# Patient Record
Sex: Female | Born: 1955
Health system: Southern US, Community
[De-identification: ages and names within clinical notes are randomized; demographics above are authoritative.]

## PROBLEM LIST (undated history)

## (undated) DIAGNOSIS — M1990 Unspecified osteoarthritis, unspecified site: Secondary | ICD-10-CM

## (undated) DIAGNOSIS — K219 Gastro-esophageal reflux disease without esophagitis: Secondary | ICD-10-CM

## (undated) DIAGNOSIS — E785 Hyperlipidemia, unspecified: Secondary | ICD-10-CM

## (undated) DIAGNOSIS — F329 Major depressive disorder, single episode, unspecified: Secondary | ICD-10-CM

## (undated) DIAGNOSIS — I2510 Atherosclerotic heart disease of native coronary artery without angina pectoris: Secondary | ICD-10-CM

## (undated) DIAGNOSIS — F419 Anxiety disorder, unspecified: Secondary | ICD-10-CM

## (undated) DIAGNOSIS — G43909 Migraine, unspecified, not intractable, without status migrainosus: Secondary | ICD-10-CM

## (undated) DIAGNOSIS — T7840XA Allergy, unspecified, initial encounter: Secondary | ICD-10-CM

## (undated) DIAGNOSIS — I447 Left bundle-branch block, unspecified: Secondary | ICD-10-CM

## (undated) DIAGNOSIS — F32A Depression, unspecified: Secondary | ICD-10-CM

## (undated) HISTORY — DX: Hyperlipidemia, unspecified: E78.5

## (undated) HISTORY — DX: Migraine, unspecified, not intractable, without status migrainosus: G43.909

## (undated) HISTORY — PX: FOOT SURGERY: SHX648

## (undated) HISTORY — DX: Atherosclerotic heart disease of native coronary artery without angina pectoris: I25.10

## (undated) HISTORY — DX: Depression, unspecified: F32.A

## (undated) HISTORY — DX: Gastro-esophageal reflux disease without esophagitis: K21.9

## (undated) HISTORY — DX: Allergy, unspecified, initial encounter: T78.40XA

## (undated) HISTORY — DX: Anxiety disorder, unspecified: F41.9

## (undated) HISTORY — DX: Unspecified osteoarthritis, unspecified site: M19.90

## (undated) HISTORY — DX: Major depressive disorder, single episode, unspecified: F32.9

## (undated) HISTORY — DX: Left bundle-branch block, unspecified: I44.7

---

## 1993-11-24 HISTORY — PX: ABDOMINAL HYSTERECTOMY: SHX81

## 2004-02-26 ENCOUNTER — Encounter: Admission: RE | Admit: 2004-02-26 | Discharge: 2004-02-26 | Payer: Self-pay | Admitting: Otolaryngology

## 2004-03-04 ENCOUNTER — Encounter: Admission: RE | Admit: 2004-03-04 | Discharge: 2004-03-04 | Payer: Self-pay | Admitting: Otolaryngology

## 2004-05-07 ENCOUNTER — Ambulatory Visit: Admission: RE | Admit: 2004-05-07 | Discharge: 2004-05-07 | Payer: Self-pay | Admitting: Pulmonary Disease

## 2004-09-19 ENCOUNTER — Encounter: Admission: RE | Admit: 2004-09-19 | Discharge: 2004-09-19 | Payer: Self-pay | Admitting: Family Medicine

## 2005-02-04 ENCOUNTER — Ambulatory Visit: Payer: Self-pay | Admitting: Pulmonary Disease

## 2006-05-28 ENCOUNTER — Ambulatory Visit: Payer: Self-pay | Admitting: Pulmonary Disease

## 2006-06-08 ENCOUNTER — Encounter: Admission: RE | Admit: 2006-06-08 | Discharge: 2006-09-06 | Payer: Self-pay | Admitting: Specialist

## 2007-08-16 ENCOUNTER — Ambulatory Visit: Payer: Self-pay | Admitting: Pulmonary Disease

## 2010-03-26 ENCOUNTER — Emergency Department (HOSPITAL_COMMUNITY): Admission: EM | Admit: 2010-03-26 | Discharge: 2010-03-26 | Payer: Self-pay | Admitting: Family Medicine

## 2010-04-04 ENCOUNTER — Emergency Department (HOSPITAL_COMMUNITY): Admission: EM | Admit: 2010-04-04 | Discharge: 2010-04-04 | Payer: Self-pay | Admitting: Family Medicine

## 2010-09-07 ENCOUNTER — Emergency Department (HOSPITAL_COMMUNITY): Admission: EM | Admit: 2010-09-07 | Discharge: 2010-09-08 | Payer: Self-pay | Admitting: Emergency Medicine

## 2011-02-11 LAB — POCT RAPID STREP A (OFFICE): Streptococcus, Group A Screen (Direct): NEGATIVE

## 2012-12-27 ENCOUNTER — Ambulatory Visit (INDEPENDENT_AMBULATORY_CARE_PROVIDER_SITE_OTHER): Payer: PRIVATE HEALTH INSURANCE | Admitting: Family Medicine

## 2012-12-27 VITALS — BP 133/71 | HR 77 | Temp 98.2°F | Resp 18 | Ht 66.0 in | Wt 208.0 lb

## 2012-12-27 DIAGNOSIS — K219 Gastro-esophageal reflux disease without esophagitis: Secondary | ICD-10-CM

## 2012-12-27 DIAGNOSIS — F329 Major depressive disorder, single episode, unspecified: Secondary | ICD-10-CM | POA: Insufficient documentation

## 2012-12-27 DIAGNOSIS — M25519 Pain in unspecified shoulder: Secondary | ICD-10-CM

## 2012-12-27 DIAGNOSIS — G43909 Migraine, unspecified, not intractable, without status migrainosus: Secondary | ICD-10-CM | POA: Insufficient documentation

## 2012-12-27 MED ORDER — PAROXETINE HCL 30 MG PO TABS: 30.0000 mg | ORAL_TABLET | ORAL | Status: DC

## 2012-12-27 MED ORDER — OMEPRAZOLE 20 MG PO CPDR: 20.0000 mg | DELAYED_RELEASE_CAPSULE | Freq: Every day | ORAL | Status: DC

## 2012-12-27 MED ORDER — AMITRIPTYLINE HCL 75 MG PO TABS: 75.0000 mg | ORAL_TABLET | Freq: Every day | ORAL | Status: DC

## 2012-12-27 MED ORDER — BUPROPION HCL ER (XL) 150 MG PO TB24: 150.0000 mg | ORAL_TABLET | Freq: Every day | ORAL | Status: DC

## 2012-12-27 MED ORDER — RIZATRIPTAN BENZOATE 5 MG PO TABS: 5.0000 mg | ORAL_TABLET | ORAL | Status: DC | PRN

## 2012-12-27 NOTE — Progress Notes (Signed)
[facility] [address] [place] [address] [phone]  Date:  12/27/2012   Name:  Mary Glass   DOB:  11-20-1956   MRN:  [id number]  PCP:  No primary provider on file.    Chief Complaint: Medication Refill and Depression   History of Present Illness:  Mary Glass is a 57 y.o. very pleasant female patient who presents with the following:  Her old PCP retired, and then her replacement PCP moved away so she is seeking to establish care with [facility].  Sh has had a rough couple of years and situation factors have exacerbated her depression. her father died last spring- she was his primary caretaker.  Then they found out that her mother has colon cancer this past summer.   She also lost her job due to missing work due to frequent migraine HA  She has severe GERD which affects her voice- she is a Sport and exercise psychologist. Omeprazole does help, but is not as good as aciphex was in the past.  However, aciphex was not covered by her insurance.  She is still taking omeprazole- she ran out of her other medications one month ago.  She did feel that she was doing well with her mood medications when she was taking them and she would like to start back on these.  She notes that she is more tearful since she stopped the medications, and she is not sleeping as well.  She denies any SI/ HI She just got back on insurance.  She would like to catch up on her health maintenance including a mammogram and colonoscopy  She is s/p hysterectomy.   She has used a Land to treat her migraine HA- this seemed to help he a lot and she is using her maxalt much less  She also hurt her right shoulder while trying to lift her father last year.  It hurts the most at night.  She has noted some radiation of pain into her right wrist and some numbness on the median side of her hand for several months. She just started using a wrist brace last night which she hopes will help   There is no problem list on  file for this patient.   Past Medical History  Diagnosis Date  . Allergy   . Depression   . Anxiety     Past Surgical History  Procedure Date  . Abdominal hysterectomy     History  Substance Use Topics  . Smoking status: Never Smoker   . Smokeless tobacco: Not on file  . Alcohol Use: No    Family History  Problem Relation Age of Onset  . Cancer Mother   . Heart disease Father   . Hypertension Brother   . Cancer Maternal Grandmother   . Heart disease Paternal Grandmother     Allergies  Allergen Reactions  . Codeine   . Penicillins     Medication list has been reviewed and updated.  Current Outpatient Prescriptions on File Prior to Visit  Medication Sig Dispense Refill  . amitriptyline (ELAVIL) 75 MG tablet Take by mouth at bedtime.      Marland Kitchen buPROPion (WELLBUTRIN) 75 MG tablet Take 75 mg by mouth 2 (two) times daily.      Marland Kitchen omeprazole (PRILOSEC) 20 MG capsule Take 20 mg by mouth daily.      Marland Kitchen PARoxetine (PAXIL) 30 MG tablet Take 30 mg by mouth every morning.        Review of Systems:  As per HPI- otherwise negative.   Physical Examination: Filed Vitals:   12/27/12 1551  BP: 133/71  Pulse: 77  Temp: 98.2 F (36.8 C)  Resp: 18   Filed Vitals:   12/27/12 1551  Height: 5\' 6"  (1.676 m)  Weight: 208 lb (94.348 kg)   Body mass index is 33.57 kg/(m^2). Ideal Body Weight: Weight in (lb) to have BMI = 25: 154.6   GEN: WDWN, NAD, Non-toxic, A & O x 3, overweight HEENT: Atraumatic, Normocephalic. Neck supple. No masses, No LAD.  Bilateral TM wnl, oropharynx normal.  PEERL,EOMI.   Ears and Nose: No external deformity. CV: RRR, No M/G/R. No JVD. No thrill. No extra heart sounds. PULM: CTA B, no wheezes, crackles, rhonchi. No retractions. No resp. distress. No accessory muscle use. ABD: S, NT, ND, +BS. No rebound. No HSM. EXTR: No c/c/e NEURO Normal gait.  PSYCH: Normally interactive. Conversant. Not depressed or anxious appearing.  Calm demeanor.  Right  shoulder: she has pain with pressure over the RC insertion, and with internal and external rotation and hawkin's  No gross deficit of right hand, no swelling or redness   Assessment and Plan: 1. Depression  buPROPion (WELLBUTRIN XL) 150 MG 24 hr tablet, amitriptyline (ELAVIL) 75 MG tablet, PARoxetine (PAXIL) 30 MG tablet  2. Shoulder pain  Ambulatory referral to Orthopedic Surgery  3. Migraine  rizatriptan (MAXALT) 5 MG tablet  4. GERD (gastroesophageal reflux disease)  omeprazole (PRILOSEC) 20 MG capsule   Refilled her medications today, taper back onto paxil.   She is aware of possible interactions between her medications, especially maxalt and paxil.  Explained that this combination can cause increased risk of serotonin syndrome.  Recommended that she halve her paxil dose on days that she uses the maxalt.    She is going to call her insurance and ask about aciphex- to see if this might be covered by her new plan  recommended that she cal [facility] her colonoscopy and mammo- she plans to do so  See patient instructions for more details.      Abbe Amsterdam, MD

## 2012-12-27 NOTE — Patient Instructions (Addendum)
Take a 1/2 of your paxil pill for the first few days, then increase to a whole pill.   We will refer you to ortho for your shoulder- you will receive a call.   Let's check back in about one month to see how you are doing and check labs Remember that combining paxil and maxalt could increase your risk of serotonin syndrome- minimize maxalt use and consider taking 1/2 of a paxil on days that you do need the maxalt

## 2013-03-31 ENCOUNTER — Ambulatory Visit (INDEPENDENT_AMBULATORY_CARE_PROVIDER_SITE_OTHER): Payer: PRIVATE HEALTH INSURANCE | Admitting: Emergency Medicine

## 2013-03-31 VITALS — BP 128/81 | HR 87 | Temp 98.4°F | Resp 16 | Ht 66.0 in | Wt 208.0 lb

## 2013-03-31 DIAGNOSIS — G44009 Cluster headache syndrome, unspecified, not intractable: Secondary | ICD-10-CM

## 2013-03-31 DIAGNOSIS — G43109 Migraine with aura, not intractable, without status migrainosus: Secondary | ICD-10-CM

## 2013-03-31 DIAGNOSIS — K219 Gastro-esophageal reflux disease without esophagitis: Secondary | ICD-10-CM

## 2013-03-31 MED ORDER — OMEPRAZOLE 20 MG PO CPDR: 40.0000 mg | DELAYED_RELEASE_CAPSULE | Freq: Every day | ORAL | Status: DC

## 2013-03-31 NOTE — Progress Notes (Signed)
[facility] [address], [place] [address] [phone]  Date:  03/31/2013   Name:  Mary Glass   DOB:  1956-01-04   MRN:  [id number]  PCP:  No PCP Per Patient    Chief Complaint: Headache   History of Present Illness:  Mary Glass is a 57 y.o. very pleasant female patient who presents with the following:  History of depression and migraine headaches.  Is out of medications and needs refills on her meds she has no family physician.  For the past year or year and one half has experienced pain in the left head associated with photophobia but no discomfort with loud noises or nausea or vomiting. Says the headaches occur over several days intermittently and start on her crown and pass over the left side of her head and then pass after short time.  recur in 15-30 minutes over hours.  No neuro or visual symptoms.  She is here out of concern that her friends informed her that she needs evaluated.  Occurrences every 4-6 weeks.  Never evaluated.  No improvement with over the counter medications or other home remedies. Denies other complaint or health concern today. Has never tried her maxalt on these headaches.  Patient Active Problem List   Diagnosis Date Noted  . Migraine 12/27/2012  . Depression 12/27/2012  . GERD (gastroesophageal reflux disease) 12/27/2012    Past Medical History  Diagnosis Date  . Allergy   . Depression   . Anxiety   . Migraine   . Arthritis     Past Surgical History  Procedure Laterality Date  . Abdominal hysterectomy    . Foot surgery      History  Substance Use Topics  . Smoking status: Never Smoker   . Smokeless tobacco: Not on file  . Alcohol Use: No    Family History  Problem Relation Age of Onset  . Cancer Mother   . Arthritis Mother   . Heart disease Father   . Hypertension Brother   . Cancer Maternal Grandmother   . Heart disease Paternal Grandmother   . Arthritis Paternal Grandmother     Allergies   Allergen Reactions  . Codeine   . Penicillins     Medication list has been reviewed and updated.  Current Outpatient Prescriptions on File Prior to Visit  Medication Sig Dispense Refill  . amitriptyline (ELAVIL) 75 MG tablet Take 1 tablet (75 mg total) by mouth at bedtime.  90 tablet  3  . buPROPion (WELLBUTRIN XL) 150 MG 24 hr tablet Take 1 tablet (150 mg total) by mouth daily.  90 tablet  3  . omeprazole (PRILOSEC) 20 MG capsule Take 1 capsule (20 mg total) by mouth daily.  90 capsule  3  . PARoxetine (PAXIL) 30 MG tablet Take 1 tablet (30 mg total) by mouth every morning.  90 tablet  3  . rizatriptan (MAXALT) 5 MG tablet Take 1 tablet (5 mg total) by mouth as needed for migraine. May repeat in 2 hours if needed  10 tablet  6   No current facility-administered medications on file prior to visit.    Review of Systems:  As per HPI, otherwise negative.    Physical Examination: Filed Vitals:   03/31/13 1754  BP: 128/81  Pulse: 87  Temp: 98.4 F (36.9 C)  Resp: 16   Filed Vitals:   03/31/13 1754  Height: 5\' 6"  (1.676 m)  Weight: 208 lb (94.348 kg)   Body  mass index is 33.59 kg/(m^2). Ideal Body Weight: Weight in (lb) to have BMI = 25: 154.6  GEN: WDWN, NAD, Non-toxic, A & O x 3 HEENT: Atraumatic, Normocephalic. Neck supple. No masses, No LAD.  PRRERLA EOMI fundi benign.  CN 2-12 intact Ears and Nose: No external deformity. CV: RRR, No M/G/R. No JVD. No thrill. No extra heart sounds. PULM: CTA B, no wheezes, crackles, rhonchi. No retractions. No resp. distress. No accessory muscle use. ABD: S, NT, ND, +BS. No rebound. No HSM. EXTR: No c/c/e NEURO Normal gait.  PSYCH: Normally interactive. Conversant. Not depressed or anxious appearing.  Calm demeanor.    Assessment and Plan: Migraine Cluster headache Try maxaalt Refill omeprazole To 104  Signed,  Phillips Odor, MD

## 2013-03-31 NOTE — Patient Instructions (Addendum)
Cluster Headache  Cluster headaches are recognized by their pattern of deep, intense head pain. They normally occur on one side of the head. Typically, cluster headaches:   Are severe in nature.   Occur repeatedly over weeks to months at a time and are then followed by periods of no headaches.   Can last 15 minutes to 3 hours.   Occur at the same time each day, often at night.   Occur several times a day.  CAUSES  The exact cause of a cluster headache is not known. Some things can trigger a cluster headache, such as:   Smoking.   Alcohol use.   Lack of sleep.   High altitude travel, such as airline travel.   Change of seasons.   Certain foods, such as foods or drinks that contain nitrates, glutamate, aspartame, or tyramine.  SYMPTOMS    Severe pain that begins in or around the eye or temple.   One-sided head pain.   Feeling sick to your stomach (nauseous).   Sensitivity to light.   Runny nose.   Eye redness, tearing, and nasal stuffiness on the side of your head where you are experiencing pain.   Sweaty, pale skin of the face.   Droopy eyelid.  DIAGNOSIS   Cluster headaches are diagnosed based on symptoms and physical exam. Your caregiver may order a computerized X-ray scan (CT or CAT scan) or a computerized magnetic scan (MRI) of your head or other lab tests to see if your headaches are caused from other medical conditions.   TREATMENT    Medications for pain relief and to prevent recurrent attacks.   Oxygen for pain relief.   Biofeedback programs may help reduce headache pain.  Some people may need a combination of medicines for cluster headaches. It may be helpful to keep a headache diary. This may help you find a trend for what is triggering your headaches. Your caregiver can develop a treatment plan that can be helpful to you.   HOME CARE INSTRUCTIONS   During cluster periods:   Follow a regular sleep schedule.Do not vary the amount and time that you sleep from day to day. It is  important to stay on the same schedule during a cluster period to help prevent headaches.   Avoid alcohol.   Stop smoking.  SEEK IMMEDIATE MEDICAL CARE IF:    You have any changes from your previous cluster headaches either in intensity or frequency.   You are not getting relief from medicines you are taking.   You faint.   You develop weakness or numbness, especially on one side of your body or face.   You develop double vision.   You develop nausea or vomiting.   You cannot keep your balance or have difficulty talking or walking.   You develop neck pain or neck stiffness.   You have a fever.  Document Released: 11/10/2005 Document Revised: 02/02/2012 Document Reviewed: 02/08/2011  Exit[employer] Patient Information 2013 [employer].

## 2013-04-22 ENCOUNTER — Ambulatory Visit: Payer: PRIVATE HEALTH INSURANCE | Admitting: Family Medicine

## 2013-05-04 ENCOUNTER — Telehealth: Payer: Self-pay

## 2013-05-04 DIAGNOSIS — G43909 Migraine, unspecified, not intractable, without status migrainosus: Secondary | ICD-10-CM

## 2013-05-04 DIAGNOSIS — F329 Major depressive disorder, single episode, unspecified: Secondary | ICD-10-CM

## 2013-05-04 DIAGNOSIS — K219 Gastro-esophageal reflux disease without esophagitis: Secondary | ICD-10-CM

## 2013-05-04 NOTE — Telephone Encounter (Signed)
PT STATES SHE HAVE SIGNED UP FOR [facility] AND SHE HAVE A FAX NUMBER FOR [place] TO FAX ALL HER MEDICINE TO THEM PLEASE FAX TO [phone] AND YOU MAY REACH PT AT [phone]

## 2013-05-05 MED ORDER — PAROXETINE HCL 30 MG PO TABS: 30.0000 mg | ORAL_TABLET | ORAL | Status: DC

## 2013-05-05 MED ORDER — RIZATRIPTAN BENZOATE 5 MG PO TABS: 5.0000 mg | ORAL_TABLET | ORAL | Status: DC | PRN

## 2013-05-05 MED ORDER — BUPROPION HCL ER (XL) 150 MG PO TB24: 150.0000 mg | ORAL_TABLET | Freq: Every day | ORAL | Status: DC

## 2013-05-05 MED ORDER — OMEPRAZOLE 20 MG PO CPDR: 40.0000 mg | DELAYED_RELEASE_CAPSULE | Freq: Every day | ORAL | Status: DC

## 2013-05-05 NOTE — Telephone Encounter (Signed)
Pt verified that she needs these sent to [facility].

## 2013-05-05 NOTE — Telephone Encounter (Signed)
Pt needs refills on omeprazole, paxil, wellbutrin, and maxalt.

## 2013-05-05 NOTE — Telephone Encounter (Signed)
Sent Rx's in.

## 2013-11-13 ENCOUNTER — Other Ambulatory Visit: Payer: Self-pay | Admitting: Physician Assistant

## 2014-01-16 ENCOUNTER — Ambulatory Visit (INDEPENDENT_AMBULATORY_CARE_PROVIDER_SITE_OTHER): Payer: BC Managed Care – PPO | Admitting: Internal Medicine

## 2014-01-16 ENCOUNTER — Encounter: Payer: Self-pay | Admitting: Internal Medicine

## 2014-01-16 VITALS — BP 124/86 | HR 85 | Temp 98.0°F | Ht 65.25 in | Wt 216.5 lb

## 2014-01-16 DIAGNOSIS — R232 Flushing: Secondary | ICD-10-CM

## 2014-01-16 DIAGNOSIS — K219 Gastro-esophageal reflux disease without esophagitis: Secondary | ICD-10-CM

## 2014-01-16 DIAGNOSIS — G43909 Migraine, unspecified, not intractable, without status migrainosus: Secondary | ICD-10-CM

## 2014-01-16 DIAGNOSIS — E669 Obesity, unspecified: Secondary | ICD-10-CM

## 2014-01-16 DIAGNOSIS — N951 Menopausal and female climacteric states: Secondary | ICD-10-CM

## 2014-01-16 DIAGNOSIS — G4700 Insomnia, unspecified: Secondary | ICD-10-CM | POA: Insufficient documentation

## 2014-01-16 DIAGNOSIS — F3289 Other specified depressive episodes: Secondary | ICD-10-CM

## 2014-01-16 DIAGNOSIS — F32A Depression, unspecified: Secondary | ICD-10-CM

## 2014-01-16 DIAGNOSIS — F329 Major depressive disorder, single episode, unspecified: Secondary | ICD-10-CM

## 2014-01-16 DIAGNOSIS — Z0000 Encounter for general adult medical examination without abnormal findings: Secondary | ICD-10-CM

## 2014-01-16 MED ORDER — RABEPRAZOLE SODIUM 20 MG PO TBEC: 20.0000 mg | DELAYED_RELEASE_TABLET | Freq: Every day | ORAL | Status: DC

## 2014-01-16 MED ORDER — AMITRIPTYLINE HCL 75 MG PO TABS: 75.0000 mg | ORAL_TABLET | Freq: Every day | ORAL | Status: DC

## 2014-01-16 MED ORDER — PAROXETINE HCL 30 MG PO TABS: 30.0000 mg | ORAL_TABLET | Freq: Every day | ORAL | Status: DC

## 2014-01-16 MED ORDER — ZOLPIDEM TARTRATE 5 MG PO TABS: 5.0000 mg | ORAL_TABLET | Freq: Every evening | ORAL | Status: DC | PRN

## 2014-01-16 MED ORDER — BUPROPION HCL ER (XL) 150 MG PO TB24: 150.0000 mg | ORAL_TABLET | Freq: Every day | ORAL | Status: DC

## 2014-01-16 MED ORDER — RIZATRIPTAN BENZOATE 5 MG PO TABS: 5.0000 mg | ORAL_TABLET | ORAL | Status: DC | PRN

## 2014-01-16 NOTE — Assessment & Plan Note (Signed)
Encouraged her to work on diet and exercise 

## 2014-01-16 NOTE — Assessment & Plan Note (Signed)
Not well controlled on amitriptyline She agrees to try low dose [place]

## 2014-01-16 NOTE — Patient Instructions (Addendum)

## 2014-01-16 NOTE — Assessment & Plan Note (Signed)
Seems to be more frequent but overall controlled with Maxalt Follow up with Dr. Catalina Gravel

## 2014-01-16 NOTE — Progress Notes (Signed)
Pre visit review using our clinic review tool, if applicable. No additional management support is needed unless otherwise documented below in the visit note. 

## 2014-01-16 NOTE — Assessment & Plan Note (Signed)
She request to switch back to Aciphex RX ordered today D/C prilosec

## 2014-01-16 NOTE — Progress Notes (Signed)
HPI  Pt presents to the clinic today to establish care. She has not had a PCP in the last few years. She has gone to [facility] if she needs something. She has multiple concerns today.  1- Insomnia: she has been on amitriptyline. She reports that it is not as effective as it has been in the past. She has not tried anything else in the past. She has been hesitant to try ambien in the past because she is concerned that it is too strong. 2- Depression: Seems to be getting worse since the death of her parents. She is on Paxil and wellbutrin. She does not feel that either of theses are effective. She has not considered speaking with a therapist.  3- Headaches. These have been increasing in frequency over the last year. They seem to be worse with stress. She is on maxalt which works really well. She is seen by Dr. Catalina Gravel for her headaches. 4- Hot flashes: These mostly occur during the day. She has tried black cohosh and estroven with some relief. She is hesitant about starting hormone therapy but would like to know what her options are. 5- GERD: not well controlled on prilosec. She would like to be switched back to aciphex, but she thinks her insurance will require a PA.  Flu: not this last year Tetanus: unsure of date Pap Smear: 2012 Mammogram: more than 2 years ago Colon Screening: never Eye Doctor: yearly Dentist: as needed  Past Medical History  Diagnosis Date  . Allergy   . Depression   . Anxiety   . Migraine   . Arthritis     Current Outpatient Prescriptions  Medication Sig Dispense Refill  . amitriptyline (ELAVIL) 75 MG tablet Take 1 tablet (75 mg total) by mouth at bedtime.  90 tablet  3  . buPROPion (WELLBUTRIN XL) 150 MG 24 hr tablet Take 1 tablet (150 mg total) by mouth daily. PATIENT NEEDS OFFICE VISIT FOR ADDITIONAL REFILLS  30 tablet  0  . cetirizine (ZYRTEC) 10 MG tablet Take 10 mg by mouth daily.      . Glucosamine-Chondroitin (GLUCOSAMINE CHONDR COMPLEX PO) Take 1 capsule by mouth  daily.      Marland Kitchen KRILL OIL PO Take 1 capsule by mouth 2 (two) times daily.      . Multiple Vitamins-Minerals (PRESERVISION AREDS PO) Take 1 capsule by mouth 2 (two) times daily.      Marland Kitchen omeprazole (PRILOSEC) 20 MG capsule Take 2 capsules (40 mg total) by mouth daily. PATIENT NEEDS OFFICE VISIT FOR ADDITIONAL REFILLS  180 capsule  0  . PARoxetine (PAXIL) 30 MG tablet Take 1 tablet (30 mg total) by mouth daily. PATIENT NEEDS OFFICE VISIT FOR ADDITIONAL REFILLS  30 tablet  0  . rizatriptan (MAXALT) 5 MG tablet Take 1 tablet (5 mg total) by mouth as needed for migraine. May repeat in 2 hours if needed  30 tablet  1   No current facility-administered medications for this visit.    Allergies  Allergen Reactions  . Codeine   . Penicillins     Family History  Problem Relation Age of Onset  . Arthritis Mother   . Colon cancer Mother   . Heart disease Father   . Parkinson's disease Father   . Hypertension Brother   . Cancer Maternal Grandmother   . Heart disease Paternal Grandmother   . Arthritis Paternal Grandmother     History   Social History  . Marital Status: Married    Spouse Name:  N/A    Number of Children: N/A  . Years of Education: N/A   Occupational History  . Not on file.   Social History Main Topics  . Smoking status: Never Smoker   . Smokeless tobacco: Never Used  . Alcohol Use: No  . Drug Use: No  . Sexual Activity: Yes    Birth Control/ Protection: None   Other Topics Concern  . Not on file   Social History Narrative  . No narrative on file    ROS:  Constitutional: Pt reports headaches. Denies fever, malaise, fatigue, or abrupt weight changes.  HEENT: Denies eye pain, eye redness, ear pain, ringing in the ears, wax buildup, runny nose, nasal congestion, bloody nose, or sore throat. Respiratory: Denies difficulty breathing, shortness of breath, cough or sputum production.   Cardiovascular: Denies chest pain, chest tightness, palpitations or swelling in the  hands or feet.  Gastrointestinal: Pt reports reflux. Denies abdominal pain, bloating, constipation, diarrhea or blood in the stool.  GU: Denies frequency, urgency, pain with urination, blood in urine, odor or discharge. Musculoskeletal: Denies decrease in range of motion, difficulty with gait, muscle pain or joint pain and swelling.  Skin: Pt reports dry skin. Denies redness, rashes, lesions or ulcercations.  Neurological: Pt reports insomnia. Denies dizziness, difficulty with memory, difficulty with speech or problems with balance and coordination.  Psych: Pt reports anxiety, depression and stress. Denies SI/HI.  No other specific complaints in a complete review of systems (except as listed in HPI above).  PE:  BP 124/86  Pulse 85  Temp(Src) 98 F (36.7 C) (Oral)  Ht 5' 5.25" (1.657 m)  Wt 216 lb 8 oz (98.204 kg)  BMI 35.77 kg/m2  SpO2 97% Wt Readings from Last 3 Encounters:  01/16/14 216 lb 8 oz (98.204 kg)  03/31/13 208 lb (94.348 kg)  12/27/12 208 lb (94.348 kg)    General: Appears her stated age, obese but well developed, well nourished in NAD. HEENT: Head: normal shape and size; Eyes: sclera white, no icterus, conjunctiva pink, PERRLA and EOMs intact; Ears: Tm's gray and intact, normal light reflex; Nose: mucosa pink and moist, septum midline; Throat/Mouth: Teeth present, mucosa pink and moist, no lesions or ulcerations noted.  Neck: Normal range of motion. Neck supple, trachea midline. No massses, lumps or thyromegaly present.  Cardiovascular: Normal rate and rhythm. S1,S2 noted.  No murmur, rubs or gallops noted. No JVD or BLE edema. No carotid bruits noted. Pulmonary/Chest: Normal effort and positive vesicular breath sounds. No respiratory distress. No wheezes, rales or ronchi noted.  Abdomen: Soft and nontender. Normal bowel sounds, no bruits noted. No distention or masses noted. Liver, spleen and kidneys non palpable. Musculoskeletal: Normal range of motion. No signs of  joint swelling. No difficulty with gait.  Neurological: Alert and oriented. Cranial nerves II-XII intact. Coordination normal. +DTRs bilaterally. Psychiatric: Mood and affect normal. Behavior is normal. Judgment and thought content normal.      Assessment and Plan:  Prevent Health Maintenance:  Encouraged pt to work on diet and exercise Will obtain basic screening labs today including A1C Will order mammogram and colon screening She declines all prophylactic vaccines at this time  RTC in 6 months or sooner if needed

## 2014-01-16 NOTE — Assessment & Plan Note (Signed)
She does not want to start HRT  She will continue black cohosh OTC

## 2014-01-16 NOTE — Assessment & Plan Note (Signed)
Worse since the loss of her parents She thinks the medication is not effective She disagrees with going up on the wellbutrin at this time I encouraged her to discuss her feelings with a therapist

## 2014-01-17 ENCOUNTER — Other Ambulatory Visit: Payer: Self-pay

## 2014-01-17 DIAGNOSIS — G4700 Insomnia, unspecified: Secondary | ICD-10-CM

## 2014-01-17 LAB — COMPREHENSIVE METABOLIC PANEL
ALT: 38 U/L — ABNORMAL HIGH (ref 0–35)
AST: 28 U/L (ref 0–37)
Albumin: 4 g/dL (ref 3.5–5.2)
Alkaline Phosphatase: 84 U/L (ref 39–117)
BUN: 10 mg/dL (ref 6–23)
CO2: 27 mEq/L (ref 19–32)
Calcium: 9.3 mg/dL (ref 8.4–10.5)
Chloride: 106 mEq/L (ref 96–112)
Creatinine, Ser: 0.9 mg/dL (ref 0.4–1.2)
GFR: 71.34 mL/min (ref >60.00)
Glucose, Bld: 99 mg/dL (ref 70–99)
Potassium: 3.8 mEq/L (ref 3.5–5.1)
Sodium: 140 mEq/L (ref 135–145)
Total Bilirubin: 0.4 mg/dL (ref 0.3–1.2)
Total Protein: 7 g/dL (ref 6.0–8.3)

## 2014-01-17 LAB — LIPID PANEL
Cholesterol: 246 mg/dL — ABNORMAL HIGH (ref 0–200)
HDL: 32.8 mg/dL — ABNORMAL LOW (ref >39.00)
Total CHOL/HDL Ratio: 8
Triglycerides: 398 mg/dL — ABNORMAL HIGH (ref 0.0–149.0)
VLDL: 79.6 mg/dL — ABNORMAL HIGH (ref 0.0–40.0)

## 2014-01-17 LAB — TSH: TSH: 0.79 u[IU]/mL (ref 0.35–5.50)

## 2014-01-17 LAB — CBC
HCT: 40.8 % (ref 36.0–46.0)
Hemoglobin: 13.3 g/dL (ref 12.0–15.0)
MCHC: 32.7 g/dL (ref 30.0–36.0)
MCV: 87.1 fl (ref 78.0–100.0)
Platelets: 356 10*3/uL (ref 150.0–400.0)
RBC: 4.68 Mil/uL (ref 3.87–5.11)
RDW: 13.6 % (ref 11.5–14.6)
WBC: 7.7 10*3/uL (ref 4.5–10.5)

## 2014-01-17 LAB — LDL CHOLESTEROL, DIRECT: Direct LDL: 147.7 mg/dL

## 2014-01-17 LAB — HEMOGLOBIN A1C: Hgb A1c MFr Bld: 5.7 % (ref 4.6–6.5)

## 2014-01-17 MED ORDER — ZOLPIDEM TARTRATE 5 MG PO TABS: 5.0000 mg | ORAL_TABLET | Freq: Every evening | ORAL | Status: DC | PRN

## 2014-01-17 NOTE — Telephone Encounter (Signed)
Rx called into pharmacy as instructed 

## 2014-01-18 ENCOUNTER — Telehealth: Payer: Self-pay

## 2014-01-20 ENCOUNTER — Other Ambulatory Visit: Payer: Self-pay

## 2014-01-20 MED ORDER — ROSUVASTATIN CALCIUM 10 MG PO TABS: 10.0000 mg | ORAL_TABLET | Freq: Every day | ORAL | Status: DC

## 2014-01-23 NOTE — Telephone Encounter (Signed)
Opened in error

## 2014-01-24 ENCOUNTER — Telehealth: Payer: Self-pay

## 2014-01-24 NOTE — Telephone Encounter (Signed)
Ok noted - thank you

## 2014-01-24 NOTE — Telephone Encounter (Signed)
Pt left v/m pt has been taking ambien for 1 week and pt thinks Lorrin Mais is causing h/as and pt is going to stop [place] and pt is going to restart amitriptyline.

## 2014-02-06 ENCOUNTER — Encounter: Payer: Self-pay | Admitting: Internal Medicine

## 2014-02-11 ENCOUNTER — Other Ambulatory Visit: Payer: Self-pay | Admitting: Physician Assistant

## 2014-02-15 ENCOUNTER — Encounter: Payer: Self-pay | Admitting: Internal Medicine

## 2014-02-15 ENCOUNTER — Ambulatory Visit (INDEPENDENT_AMBULATORY_CARE_PROVIDER_SITE_OTHER): Payer: BC Managed Care – PPO | Admitting: Internal Medicine

## 2014-02-15 VITALS — BP 124/68 | HR 89 | Temp 97.8°F | Ht 65.25 in | Wt 219.5 lb

## 2014-02-15 DIAGNOSIS — B351 Tinea unguium: Secondary | ICD-10-CM

## 2014-02-15 DIAGNOSIS — G4700 Insomnia, unspecified: Secondary | ICD-10-CM

## 2014-02-15 DIAGNOSIS — E785 Hyperlipidemia, unspecified: Secondary | ICD-10-CM

## 2014-02-15 DIAGNOSIS — G43909 Migraine, unspecified, not intractable, without status migrainosus: Secondary | ICD-10-CM

## 2014-02-15 MED ORDER — TRAMADOL HCL 50 MG PO TABS: 50.0000 mg | ORAL_TABLET | Freq: Three times a day (TID) | ORAL | Status: DC | PRN

## 2014-02-15 NOTE — Assessment & Plan Note (Signed)
On maxalt and amitriptyline Will call in tramadol only for severe headaches No more that 20 tabs per month [facility] with [facility]

## 2014-02-15 NOTE — Assessment & Plan Note (Signed)
Discussed options- topical treatment vs systemic treatment She would rather follow up with her podiatrist first

## 2014-02-15 NOTE — Progress Notes (Signed)
Subjective:    Patient ID: Mary Glass, female    DOB: Apr 07, 1956, 58 y.o.   MRN: [id number]  HPI Pt presents to the clinic today for medication followup. At her last visit, she reported that the amitriptyline was not as effective as it once had been. We switched her to lose dose ambien. She feels like the [place] Mais was causing her to wake up with headaches. She has stopped that and request to go back on amitriptyline.  She also has a few other concerns:  Migraines: She tends to get left sided headaches with the weather changes. She describes the headaches as sharp and severe. She reports that she takes maxalt which seems to help if she takes it when she first notices the headache. She also takes amitriptyline for that as well. Sometimes the headaches get so bad that the meds she has does not work. She request RX for tramadol for severe headaches. [facility].  Toenail fungus: She noticed this in her right big toe shortly after getting a pedicure for her daughters wedding. She has not tried anything OTC. She does have a podiatrist and she is considering going to see him.  Review of Systems      Past Medical History  Diagnosis Date  . Allergy   . Depression   . Anxiety   . Migraine   . Arthritis     Current Outpatient Prescriptions  Medication Sig Dispense Refill  . amitriptyline (ELAVIL) 75 MG tablet Take 1 tablet (75 mg total) by mouth at bedtime.  90 tablet  3  . buPROPion (WELLBUTRIN XL) 150 MG 24 hr tablet Take 1 tablet (150 mg total) by mouth daily. PATIENT NEEDS OFFICE VISIT FOR ADDITIONAL REFILLS  90 tablet  3  . cetirizine (ZYRTEC) 10 MG tablet Take 10 mg by mouth daily.      . Glucosamine-Chondroitin (GLUCOSAMINE CHONDR COMPLEX PO) Take 1 capsule by mouth daily.      Marland Kitchen KRILL OIL PO Take 1 capsule by mouth 2 (two) times daily.      . Multiple Vitamins-Minerals (PRESERVISION AREDS PO) Take 1 capsule by mouth 2 (two) times  daily.      Marland Kitchen PARoxetine (PAXIL) 30 MG tablet Take 1 tablet (30 mg total) by mouth daily. PATIENT NEEDS OFFICE VISIT FOR ADDITIONAL REFILLS  90 tablet  3  . rizatriptan (MAXALT) 5 MG tablet Take 1 tablet (5 mg total) by mouth as needed for migraine. May repeat in 2 hours if needed  30 tablet  1  . rosuvastatin (CRESTOR) 10 MG tablet Take 1 tablet (10 mg total) by mouth daily.  30 tablet  2  . traMADol (ULTRAM) 50 MG tablet Take 1 tablet (50 mg total) by mouth every 8 (eight) hours as needed.  20 tablet  0   No current facility-administered medications for this visit.    Allergies  Allergen Reactions  . Ambien [Zolpidem Tartrate] Other (See Comments)    headaches  . Codeine   . Penicillins     Family History  Problem Relation Age of Onset  . Arthritis Mother   . Colon cancer Mother   . Heart disease Father   . Parkinson's disease Father   . Hypertension Brother   . Cancer Maternal Grandmother   . Heart disease Paternal Grandmother   . Arthritis Paternal Grandmother     History   Social History  . Marital Status: Married    Spouse Name:  N/A    Number of Children: N/A  . Years of Education: N/A   Occupational History  . Not on file.   Social History Main Topics  . Smoking status: Never Smoker   . Smokeless tobacco: Never Used  . Alcohol Use: No  . Drug Use: No  . Sexual Activity: Yes    Birth Control/ Protection: None   Other Topics Concern  . Not on file   Social History Narrative  . No narrative on file     Constitutional: Denies fever, malaise, fatigue, headache or abrupt weight changes.  Skin: Pt reports discoloration of right big toe. Denies redness, rashes, lesions or ulcercations.    No other specific complaints in a complete review of systems (except as listed in HPI above).  Objective:   Physical Exam   BP 124/68  Pulse 89  Temp(Src) 97.8 F (36.6 C) (Oral)  Ht 5' 5.25" (1.657 m)  Wt 219 lb 8 oz (99.565 kg)  BMI 36.26 kg/m2  SpO2 94% Wt  Readings from Last 3 Encounters:  02/15/14 219 lb 8 oz (99.565 kg)  01/16/14 216 lb 8 oz (98.204 kg)  03/31/13 208 lb (94.348 kg)    General: Appears her stated age, well developed, well nourished in NAD. Skin: Warm, dry and intact. Fungal infection noted of right big toenail.  Cardiovascular: Normal rate and rhythm. S1,S2 noted.  No murmur, rubs or gallops noted. No JVD or BLE edema. No carotid bruits noted. Pulmonary/Chest: Normal effort and positive vesicular breath sounds. No respiratory distress. No wheezes, rales or ronchi noted.  Abdomen: Soft and nontender. Normal bowel sounds, no bruits noted. No distention or masses noted. Liver, spleen and kidneys non palpable. Musculoskeletal: Normal range of motion. No signs of joint swelling. No difficulty with gait.  Neurological: Alert and oriented. Cranial nerves II-XII intact. Coordination normal. +DTRs bilaterally. Psychiatric: Mood and affect normal. Behavior is normal. Judgment and thought content normal.     BMET    Component Value Date/Time   NA 140 01/16/2014 1614   K 3.8 01/16/2014 1614   CL 106 01/16/2014 1614   CO2 27 01/16/2014 1614   GLUCOSE 99 01/16/2014 1614   BUN 10 01/16/2014 1614   CREATININE 0.9 01/16/2014 1614   CALCIUM 9.3 01/16/2014 1614    Lipid Panel     Component Value Date/Time   CHOL 246* 01/16/2014 1614   TRIG 398.0* 01/16/2014 1614   HDL 32.80* 01/16/2014 1614   CHOLHDL 8 01/16/2014 1614   VLDL 79.6* 01/16/2014 1614    CBC    Component Value Date/Time   WBC 7.7 01/16/2014 1614   RBC 4.68 01/16/2014 1614   HGB 13.3 01/16/2014 1614   HCT 40.8 01/16/2014 1614   PLT 356.0 01/16/2014 1614   MCV 87.1 01/16/2014 1614   MCHC 32.7 01/16/2014 1614   RDW 13.6 01/16/2014 1614    Hgb A1C Lab Results  Component Value Date   HGBA1C 5.7 01/16/2014        Assessment & Plan:

## 2014-02-15 NOTE — Progress Notes (Signed)
Pre visit review using our clinic review tool, if applicable. No additional management support is needed unless otherwise documented below in the visit note. 

## 2014-02-15 NOTE — Assessment & Plan Note (Signed)
She had headaches with the [place] She prefers to switch back to amitriptyline

## 2014-02-15 NOTE — Patient Instructions (Addendum)
Migraine Headache A migraine headache is an intense, throbbing pain on one or both sides of your head. A migraine can last for 30 minutes to several hours. CAUSES  The exact cause of a migraine headache is not always known. However, a migraine may be caused when nerves in the brain become irritated and release chemicals that cause inflammation. This causes pain. Certain things may also trigger migraines, such as:  Alcohol.  Smoking.  Stress.  Menstruation.  Aged cheeses.  Foods or drinks that contain nitrates, glutamate, aspartame, or tyramine.  Lack of sleep.  Chocolate.  Caffeine.  Hunger.  Physical exertion.  Fatigue.  Medicines used to treat chest pain (nitroglycerine), birth control pills, estrogen, and some blood pressure medicines. SIGNS AND SYMPTOMS  Pain on one or both sides of your head.  Pulsating or throbbing pain.  Severe pain that prevents daily activities.  Pain that is aggravated by any physical activity.  Nausea, vomiting, or both.  Dizziness.  Pain with exposure to bright lights, loud noises, or activity.  General sensitivity to bright lights, loud noises, or smells. Before you get a migraine, you may get warning signs that a migraine is coming (aura). An aura may include:  Seeing flashing lights.  Seeing bright spots, halos, or zig-zag lines.  Having tunnel vision or blurred vision.  Having feelings of numbness or tingling.  Having trouble talking.  Having muscle weakness. DIAGNOSIS  A migraine headache is often diagnosed based on:  Symptoms.  Physical exam.  A CT scan or MRI of your head. These imaging tests cannot diagnose migraines, but they can help rule out other causes of headaches. TREATMENT Medicines may be given for pain and nausea. Medicines can also be given to help prevent recurrent migraines.  HOME CARE INSTRUCTIONS  Only take over-the-counter or prescription medicines for pain or discomfort as directed by your  health care provider. The use of long-term narcotics is not recommended.  Lie down in a dark, quiet room when you have a migraine.  Keep a journal to find out what may trigger your migraine headaches. For example, write down:  What you eat and drink.  How much sleep you get.  Any change to your diet or medicines.  Limit alcohol consumption.  Quit smoking if you smoke.  Get 7 9 hours of sleep, or as recommended by your health care provider.  Limit stress.  Keep lights dim if bright lights bother you and make your migraines worse. SEEK IMMEDIATE MEDICAL CARE IF:   Your migraine becomes severe.  You have a fever.  You have a stiff neck.  You have vision loss.  You have muscular weakness or loss of muscle control.  You start losing your balance or have trouble walking.  You feel faint or pass out.  You have severe symptoms that are different from your first symptoms. MAKE SURE YOU:   Understand these instructions.  Will watch your condition.  Will get help right away if you are not doing well or get worse. Document Released: 11/10/2005 Document Revised: 08/31/2013 Document Reviewed: 07/18/2013 ExitCare Patient Information 2014 [employer].  

## 2014-03-28 ENCOUNTER — Ambulatory Visit (AMBULATORY_SURGERY_CENTER): Payer: Self-pay | Admitting: *Deleted

## 2014-03-28 VITALS — Ht 65.0 in | Wt 219.0 lb

## 2014-03-28 DIAGNOSIS — Z1211 Encounter for screening for malignant neoplasm of colon: Secondary | ICD-10-CM

## 2014-03-28 MED ORDER — MOVIPREP 100 G PO SOLR: 1.0000 | Freq: Once | ORAL | Status: DC

## 2014-03-28 NOTE — Progress Notes (Signed)
No egg or soy allergy. No anesthesia problems.  No home O2.  No diet meds.  

## 2014-04-11 ENCOUNTER — Encounter: Payer: Self-pay | Admitting: Internal Medicine

## 2014-04-11 ENCOUNTER — Ambulatory Visit (AMBULATORY_SURGERY_CENTER): Payer: BC Managed Care – PPO | Admitting: Internal Medicine

## 2014-04-11 VITALS — BP 100/56 | HR 66 | Temp 99.2°F | Resp 16 | Ht 65.0 in | Wt 219.0 lb

## 2014-04-11 DIAGNOSIS — D126 Benign neoplasm of colon, unspecified: Secondary | ICD-10-CM

## 2014-04-11 DIAGNOSIS — Z8371 Family history of colonic polyps: Secondary | ICD-10-CM

## 2014-04-11 DIAGNOSIS — Z800 Family history of malignant neoplasm of digestive organs: Secondary | ICD-10-CM

## 2014-04-11 DIAGNOSIS — Z1211 Encounter for screening for malignant neoplasm of colon: Secondary | ICD-10-CM

## 2014-04-11 MED ORDER — SODIUM CHLORIDE 0.9 % IV SOLN: 500.0000 mL | INTRAVENOUS | Status: DC

## 2014-04-11 NOTE — Patient Instructions (Signed)
Discharge instructions given with verbal understanding. Handouts on polyps. Resume previous medications. YOU HAD AN ENDOSCOPIC PROCEDURE TODAY AT THE [facility]: Refer to the procedure report that was given to you for any specific questions about what was found during the examination.  If the procedure report does not answer your questions, please call your gastroenterologist to clarify.  If you requested that your care partner not be given the details of your procedure findings, then the procedure report has been included in a sealed envelope for you to review at your convenience later.  YOU SHOULD EXPECT: Some feelings of bloating in the abdomen. Passage of more gas than usual.  Walking can help get rid of the air that was put into your GI tract during the procedure and reduce the bloating. If you had a lower endoscopy (such as a colonoscopy or flexible sigmoidoscopy) you may notice spotting of blood in your stool or on the toilet paper. If you underwent a bowel prep for your procedure, then you may not have a normal bowel movement for a few days.  DIET: Your first meal following the procedure should be a light meal and then it is ok to progress to your normal diet.  A half-sandwich or bowl of soup is an example of a good first meal.  Heavy or fried foods are harder to digest and may make you feel nauseous or bloated.  Likewise meals heavy in dairy and vegetables can cause extra gas to form and this can also increase the bloating.  Drink plenty of fluids but you should avoid alcoholic beverages for 24 hours.  ACTIVITY: Your care partner should take you home directly after the procedure.  You should plan to take it easy, moving slowly for the rest of the day.  You can resume normal activity the day after the procedure however you should NOT DRIVE or use heavy machinery for 24 hours (because of the sedation medicines used during the test).    SYMPTOMS TO REPORT IMMEDIATELY: A  gastroenterologist can be reached at any hour.  During normal business hours, 8:30 AM to 5:00 PM Monday through Friday, call [phone].  After hours and on weekends, please call the GI answering service at [phone] who will take a message and have the physician on call contact you.   Following lower endoscopy (colonoscopy or flexible sigmoidoscopy):  Excessive amounts of blood in the stool  Significant tenderness or worsening of abdominal pains  Swelling of the abdomen that is new, acute  Fever of 100F or higher  FOLLOW UP: If any biopsies were taken you will be contacted by phone or by letter within the next 1-3 weeks.  Call your gastroenterologist if you have not heard about the biopsies in 3 weeks.  Our staff will call the home number listed on your records the next business day following your procedure to check on you and address any questions or concerns that you may have at that time regarding the information given to you following your procedure. This is a courtesy call and so if there is no answer at the home number and we have not heard from you through the emergency physician on call, we will assume that you have returned to your regular daily activities without incident.  SIGNATURES/CONFIDENTIALITY: You and/or your care partner have signed paperwork which will be entered into your electronic medical record.  These signatures attest to the fact that that the information above on your After Visit Summary has been   reviewed and is understood.  Full responsibility of the confidentiality of this discharge information lies with you and/or your care-partner. 

## 2014-04-11 NOTE — Progress Notes (Signed)
Called to room to assist during endoscopic procedure.  Patient ID and intended procedure confirmed with present staff. Received instructions for my participation in the procedure from the performing physician.  

## 2014-04-11 NOTE — Op Note (Signed)
[facility]  [address] [place], [phone]   COLONOSCOPY PROCEDURE REPORT  PATIENT: Mary, Glass  MR#: [id number] BIRTHDATE: [birth year] , 50  yrs. old GENDER: Female ENDOSCOPIST: Jerene Bears, MD REFERRED BY: Webb Silversmith, NP PROCEDURE DATE:  04/11/2014 PROCEDURE:   Colonoscopy with cold biopsy polypectomy and Colonoscopy with snare polypectomy First Screening Colonoscopy - Avg.  risk and is 50 yrs.  old or older - No.  Prior Negative Screening - Now for repeat screening. N/A  History of Adenoma - Now for follow-up colonoscopy & has been > or = to 3 yrs.  N/A  Polyps Removed Today? Yes. ASA CLASS:   Class II INDICATIONS:elevated risk screening, Patient's family history of colon polyps, and Patient's immediate family history of colon cancer. MEDICATIONS: MAC sedation, administered by CRNA and propofol (Diprivan) 450mg  IV  DESCRIPTION OF PROCEDURE:   After the risks benefits and alternatives of the procedure were thoroughly explained, informed consent was obtained.  A digital rectal exam revealed no rectal mass.   The LB [id number]  endoscope was introduced through the anus and advanced to the cecum, which was identified by both the appendix and ileocecal valve. No adverse events experienced. The quality of the prep was good, using MoviPrep  The instrument was then slowly withdrawn as the colon was fully examined.  COLON FINDINGS: Four sessile polyps measuring 3-6 mm in size were found at the cecum (2) and in the ascending colon (2).  Polypectomy was performed with cold forceps (1 cecum) and using cold snare (3). All resections were complete and all polyp tissue was completely retrieved.   A sessile polyp measuring 10 mm in size with a mucous cap was found at the hepatic flexure.  A polypectomy was performed using snare cautery.  The resection was complete and the polyp tissue was completely retrieved. Remaining colonic mucosa unremarkable.   Retroflexed views revealed no abnormalities. The time to cecum=7 minutes 20 seconds.  Withdrawal time=16 minutes 17 seconds.  The scope was withdrawn and the procedure completed.  COMPLICATIONS: There were no complications.     ENDOSCOPIC IMPRESSION: 1.   Four sessile polyps measuring 3-6 mm in size were found at the cecum and in the ascending colon; Polypectomy was performed with cold forceps and using cold snare 2.   Sessile polyp measuring 10 mm in size was found at the hepatic flexure; polypectomy was performed using snare cautery  RECOMMENDATIONS: 1.  Hold aspirin, aspirin products, and anti-inflammatory medication for 2 weeks. 2.  Await pathology results 3.  Timing of repeat colonoscopy will be determined by pathology findings. 4.  You will receive a letter within 1-2 weeks with the results of your biopsy as well as final recommendations.  Please call my office if you have not received a letter after 3 weeks.   eSigned:  Jerene Bears, MD 04/11/2014 12:07 PM   cc: The Patient; Webb Silversmith, NP   PATIENT NAME:  Mary, Glass MR#: [id number]

## 2014-04-12 ENCOUNTER — Telehealth: Payer: Self-pay

## 2014-04-12 NOTE — Telephone Encounter (Signed)
  Follow up Call-  Call back number 04/11/2014  Post procedure Call Back phone  # [phone]  Permission to leave phone message Yes     Patient questions:  Do you have a fever, pain , or abdominal swelling? no Pain Score  0 *  Have you tolerated food without any problems? yes  Have you been able to return to your normal activities? yes  Do you have any questions about your discharge instructions: Diet   no Medications  no Follow up visit  no  Do you have questions or concerns about your Care? no  Actions: * If pain score is 4 or above: No action needed, pain <4.

## 2014-04-18 ENCOUNTER — Encounter: Payer: Self-pay | Admitting: Internal Medicine

## 2014-04-18 ENCOUNTER — Other Ambulatory Visit: Payer: BC Managed Care – PPO

## 2014-04-19 ENCOUNTER — Other Ambulatory Visit: Payer: BC Managed Care – PPO

## 2014-04-25 ENCOUNTER — Other Ambulatory Visit: Payer: Self-pay | Admitting: Internal Medicine

## 2014-04-26 ENCOUNTER — Other Ambulatory Visit: Payer: BC Managed Care – PPO

## 2014-04-27 ENCOUNTER — Other Ambulatory Visit (INDEPENDENT_AMBULATORY_CARE_PROVIDER_SITE_OTHER): Payer: BC Managed Care – PPO

## 2014-04-27 DIAGNOSIS — E785 Hyperlipidemia, unspecified: Secondary | ICD-10-CM

## 2014-04-27 LAB — LIPID PANEL
Cholesterol: 129 mg/dL (ref 0–200)
HDL: 31.3 mg/dL — AB (ref >39.00)
LDL Cholesterol: 56 mg/dL (ref 0–99)
NonHDL: 97.7
Total CHOL/HDL Ratio: 4
Triglycerides: 207 mg/dL — ABNORMAL HIGH (ref 0.0–149.0)
VLDL: 41.4 mg/dL — AB (ref 0.0–40.0)

## 2014-05-11 ENCOUNTER — Telehealth: Payer: Self-pay

## 2014-05-11 NOTE — Telephone Encounter (Signed)
Pt left v/m requesting change from aciphex to omeprazole due to ins not covering aciphex twice a day. Left v/m requesting pt to cb;need to know what pharmacy.

## 2014-05-12 MED ORDER — OMEPRAZOLE 20 MG PO CPDR
20.0000 mg | DELAYED_RELEASE_CAPSULE | Freq: Two times a day (BID) | ORAL | Status: DC
Start: ? — End: 2014-11-27

## 2014-05-12 NOTE — Telephone Encounter (Signed)
Change that to 90 day supply # 180, 1 refill

## 2014-05-12 NOTE — Telephone Encounter (Signed)
Rx sent through e-scribe  

## 2014-05-12 NOTE — Telephone Encounter (Signed)
Pt said Webb Silversmith NP has never prescribed the omeprazole 20 mg but was on med list. Pt request 90 day supply to [facility].

## 2014-05-12 NOTE — Telephone Encounter (Signed)
I am ok with this, please send in prilosec 20 mg BID , # 60 , with 2 refills

## 2014-05-17 ENCOUNTER — Telehealth: Payer: Self-pay | Admitting: Internal Medicine

## 2014-05-17 NOTE — Telephone Encounter (Signed)
Patient Information:  Caller Name: Mary Glass  Phone: [phone]  Patient: Mary Glass, Mary Glass  Gender: Female  DOB: [birth year]  Age: 58 Years  PCP: Webb Silversmith  Office Follow Up:  Does the office need to follow up with this patient?: No  Instructions For The Office: N/A  RN Note:  Pt states she already scheduled appt for 05/18/14, but would like to be seen today if possible after 3:00 PM.  No appts after 3PM available, checked other offices.  Home care advice given pt.   Symptoms  Reason For Call & Symptoms: Pt reports she has swollen left ankle and big toe, warm to the touch with redness.  Reviewed Health History In EMR: Yes  Reviewed Medications In EMR: Yes  Reviewed Allergies In EMR: Yes  Reviewed Surgeries / Procedures: Yes  Date of Onset of Symptoms: 05/16/2014  Guideline(s) Used:  Toe Pain  Disposition Per Guideline:   See Today in Office  Reason For Disposition Reached:   Looks like a boil, infected sore, deep ulcer, or other infected rash (spreading redness, pus)  Advice Given:  Call Back If:  You become worse.  Patient Will Follow Care Advice:  YES

## 2014-05-18 ENCOUNTER — Encounter: Payer: Self-pay | Admitting: Internal Medicine

## 2014-05-18 ENCOUNTER — Ambulatory Visit (INDEPENDENT_AMBULATORY_CARE_PROVIDER_SITE_OTHER): Payer: BC Managed Care – PPO | Admitting: Internal Medicine

## 2014-05-18 VITALS — BP 120/76 | HR 74 | Temp 97.9°F | Wt 214.0 lb

## 2014-05-18 DIAGNOSIS — E785 Hyperlipidemia, unspecified: Secondary | ICD-10-CM | POA: Insufficient documentation

## 2014-05-18 DIAGNOSIS — M25579 Pain in unspecified ankle and joints of unspecified foot: Secondary | ICD-10-CM

## 2014-05-18 DIAGNOSIS — M25572 Pain in left ankle and joints of left foot: Secondary | ICD-10-CM

## 2014-05-18 DIAGNOSIS — B351 Tinea unguium: Secondary | ICD-10-CM

## 2014-05-18 LAB — COMPREHENSIVE METABOLIC PANEL
ALBUMIN: 4.1 g/dL (ref 3.5–5.2)
ALK PHOS: 92 U/L (ref 39–117)
ALT: 42 U/L — ABNORMAL HIGH (ref 0–35)
AST: 32 U/L (ref 0–37)
BUN: 13 mg/dL (ref 6–23)
CALCIUM: 9 mg/dL (ref 8.4–10.5)
CO2: 24 mEq/L (ref 19–32)
CREATININE: 0.9 mg/dL (ref 0.4–1.2)
Chloride: 109 mEq/L (ref 96–112)
GFR: 67.65 mL/min (ref >60.00)
Glucose, Bld: 108 mg/dL — ABNORMAL HIGH (ref 70–99)
Potassium: 4 mEq/L (ref 3.5–5.1)
Sodium: 139 mEq/L (ref 135–145)
Total Bilirubin: 0.4 mg/dL (ref 0.2–1.2)
Total Protein: 6.6 g/dL (ref 6.0–8.3)

## 2014-05-18 LAB — LIPID PANEL
CHOLESTEROL: 133 mg/dL (ref 0–200)
HDL: 33.6 mg/dL — ABNORMAL LOW (ref >39.00)
LDL CALC: 55 mg/dL (ref 0–99)
NonHDL: 99.4
TRIGLYCERIDES: 224 mg/dL — AB (ref 0.0–149.0)
Total CHOL/HDL Ratio: 4
VLDL: 44.8 mg/dL — ABNORMAL HIGH (ref 0.0–40.0)

## 2014-05-18 MED ORDER — EFINACONAZOLE 10 % EX SOLN: 1.0000 "application " | Freq: Every day | CUTANEOUS | Status: DC

## 2014-05-18 NOTE — Patient Instructions (Addendum)
Fat and Cholesterol Control Diet  Fat and cholesterol levels in your blood and organs are influenced by your diet. High levels of fat and cholesterol may lead to diseases of the heart, small and large blood vessels, gallbladder, liver, and pancreas.  CONTROLLING FAT AND CHOLESTEROL WITH DIET  Although exercise and lifestyle factors are important, your diet is key. That is because certain foods are known to raise cholesterol and others to lower it. The goal is to balance foods for their effect on cholesterol and more importantly, to replace saturated and trans fat with other types of fat, such as monounsaturated fat, polyunsaturated fat, and omega-3 fatty acids.  On average, a person should consume no more than 15 to 17 g of saturated fat daily. Saturated and trans fats are considered "bad" fats, and they will raise LDL cholesterol. Saturated fats are primarily found in animal products such as meats, butter, and cream. However, that does not mean you need to give up all your favorite foods. Today, there are good tasting, low-fat, low-cholesterol substitutes for most of the things you like to eat. Choose low-fat or nonfat alternatives. Choose round or loin cuts of red meat. These types of cuts are lowest in fat and cholesterol. Chicken (without the skin), fish, veal, and ground turkey breast are great choices. Eliminate fatty meats, such as hot dogs and salami. Even shellfish have little or no saturated fat. Have a 3 oz (85 g) portion when you eat lean meat, poultry, or fish.  Trans fats are also called "partially hydrogenated oils." They are oils that have been scientifically manipulated so that they are solid at room temperature resulting in a longer shelf life and improved taste and texture of foods in which they are added. Trans fats are found in stick margarine, some tub margarines, cookies, crackers, and baked goods.   When baking and cooking, oils are a great substitute for butter. The monounsaturated oils are  especially beneficial since it is believed they lower LDL and raise HDL. The oils you should avoid entirely are saturated tropical oils, such as coconut and palm.   Remember to eat a lot from food groups that are naturally free of saturated and trans fat, including fish, fruit, vegetables, beans, grains (barley, rice, couscous, bulgur wheat), and pasta (without cream sauces).   IDENTIFYING FOODS THAT LOWER FAT AND CHOLESTEROL   Soluble fiber may lower your cholesterol. This type of fiber is found in fruits such as apples, vegetables such as broccoli, potatoes, and carrots, legumes such as beans, peas, and lentils, and grains such as barley. Foods fortified with plant sterols (phytosterol) may also lower cholesterol. You should eat at least 2 g per day of these foods for a cholesterol lowering effect.   Read package labels to identify low-saturated fats, trans fat free, and low-fat foods at the supermarket. Select cheeses that have only 2 to 3 g saturated fat per ounce. Use a heart-healthy tub margarine that is free of trans fats or partially hydrogenated oil. When buying baked goods (cookies, crackers), avoid partially hydrogenated oils. Breads and muffins should be made from whole grains (whole-wheat or whole oat flour, instead of "flour" or "enriched flour"). Buy non-creamy canned soups with reduced salt and no added fats.   FOOD PREPARATION TECHNIQUES   Never deep-fry. If you must fry, either stir-fry, which uses very little fat, or use non-stick cooking sprays. When possible, broil, bake, or roast meats, and steam vegetables. Instead of putting butter or margarine on vegetables, use lemon   and herbs, applesauce, and cinnamon (for squash and sweet potatoes). Use nonfat yogurt, salsa, and low-fat dressings for salads.   LOW-SATURATED FAT / LOW-FAT FOOD SUBSTITUTES  Meats / Saturated Fat (g)  · Avoid: Steak, marbled (3 oz/85 g) / 11 g  · Choose: Steak, lean (3 oz/85 g) / 4 g  · Avoid: Hamburger (3 oz/85 g) / 7  g  · Choose: Hamburger, lean (3 oz/85 g) / 5 g  · Avoid: Ham (3 oz/85 g) / 6 g  · Choose: Ham, lean cut (3 oz/85 g) / 2.4 g  · Avoid: Chicken, with skin, dark meat (3 oz/85 g) / 4 g  · Choose: Chicken, skin removed, dark meat (3 oz/85 g) / 2 g  · Avoid: Chicken, with skin, light meat (3 oz/85 g) / 2.5 g  · Choose: Chicken, skin removed, light meat (3 oz/85 g) / 1 g  Dairy / Saturated Fat (g)  · Avoid: Whole milk (1 cup) / 5 g  · Choose: Low-fat milk, 2% (1 cup) / 3 g  · Choose: Low-fat milk, 1% (1 cup) / 1.5 g  · Choose: Skim milk (1 cup) / 0.3 g  · Avoid: Hard cheese (1 oz/28 g) / 6 g  · Choose: Skim milk cheese (1 oz/28 g) / 2 to 3 g  · Avoid: Cottage cheese, 4% fat (1 cup) / 6.5 g  · Choose: Low-fat cottage cheese, 1% fat (1 cup) / 1.5 g  · Avoid: Ice cream (1 cup) / 9 g  · Choose: Sherbet (1 cup) / 2.5 g  · Choose: Nonfat frozen yogurt (1 cup) / 0.3 g  · Choose: Frozen fruit bar / trace  · Avoid: Whipped cream (1 tbs) / 3.5 g  · Choose: Nondairy whipped topping (1 tbs) / 1 g  Condiments / Saturated Fat (g)  · Avoid: Mayonnaise (1 tbs) / 2 g  · Choose: Low-fat mayonnaise (1 tbs) / 1 g  · Avoid: Butter (1 tbs) / 7 g  · Choose: Extra light margarine (1 tbs) / 1 g  · Avoid: Coconut oil (1 tbs) / 11.8 g  · Choose: Olive oil (1 tbs) / 1.8 g  · Choose: Corn oil (1 tbs) / 1.7 g  · Choose: Safflower oil (1 tbs) / 1.2 g  · Choose: Sunflower oil (1 tbs) / 1.4 g  · Choose: Soybean oil (1 tbs) / 2.4 g  · Choose: Canola oil (1 tbs) / 1 g  Document Released: 11/10/2005 Document Revised: 03/07/2013 Document Reviewed: 05/01/2011  Exit[employer]® Patient Information ©2015 [employer]. This information is not intended to replace advice given to you by your health care provider. Make sure you discuss any questions you have with your health care provider.

## 2014-05-18 NOTE — Progress Notes (Signed)
Pre visit review using our clinic review tool, if applicable. No additional management support is needed unless otherwise documented below in the visit note. 

## 2014-05-18 NOTE — Assessment & Plan Note (Signed)
Will repeat lipid profile and CMET today Continue crestor at this time- will adjust dose if needed

## 2014-05-18 NOTE — Addendum Note (Signed)
Addended by: Jearld Fenton on: 05/18/2014 10:07 AM   Modules accepted: Orders

## 2014-05-18 NOTE — Progress Notes (Signed)
Subjective:    Patient ID: Mary Glass, female    DOB: [birth year], 58 y.o.   MRN: [id number]  HPI  Pt presents to the clinic today with c/o toenail fungus. She noticed this 1 day ago on the big toe of her left foot. She has also notices some swelling and soreness in her left ankle. She does report that she stands on her feet for a long period of time. She is using toenail fungus nail drops. She has also tried soaking her foot in epsom salt.  She is also due to have her cholesterol checked. She was started on Crestor 10 mg daily 4 months ago. She denies myalgias, joint pain, nausea or diarrhea.  Review of Systems      Past Medical History  Diagnosis Date  . Allergy   . Depression   . Anxiety   . Migraine   . Arthritis   . GERD (gastroesophageal reflux disease)   . Hyperlipidemia     Current Outpatient Prescriptions  Medication Sig Dispense Refill  . amitriptyline (ELAVIL) 75 MG tablet Take 1 tablet (75 mg total) by mouth at bedtime.  90 tablet  3  . buPROPion (WELLBUTRIN XL) 150 MG 24 hr tablet Take 1 tablet (150 mg total) by mouth daily. PATIENT NEEDS OFFICE VISIT FOR ADDITIONAL REFILLS  90 tablet  3  . cetirizine (ZYRTEC) 10 MG tablet Take 10 mg by mouth daily.      . Glucosamine-Chondroitin (GLUCOSAMINE CHONDR COMPLEX PO) Take 1 capsule by mouth daily.      Marland Kitchen KRILL OIL PO Take 1 capsule by mouth 2 (two) times daily.      Marland Kitchen omeprazole (PRILOSEC) 20 MG capsule Take 1 capsule (20 mg total) by mouth 2 (two) times daily before a meal.  180 capsule  1  . PARoxetine (PAXIL) 30 MG tablet Take 1 tablet (30 mg total) by mouth daily. PATIENT NEEDS OFFICE VISIT FOR ADDITIONAL REFILLS  90 tablet  3  . rizatriptan (MAXALT) 5 MG tablet Take 1 tablet (5 mg total) by mouth as needed for migraine. May repeat in 2 hours if needed  30 tablet  1  . rosuvastatin (CRESTOR) 10 MG tablet Take 1 tablet (10 mg total) by mouth daily.  30 tablet  0  . topiramate (TOPAMAX) 25 MG tablet Take 25 mg by  mouth at bedtime as needed. 1 tablet hs, may increase up to 6 tablets per day.      . traMADol (ULTRAM) 50 MG tablet Take 1 tablet (50 mg total) by mouth every 8 (eight) hours as needed.  20 tablet  0   No current facility-administered medications for this visit.    Allergies  Allergen Reactions  . Ambien [Zolpidem Tartrate] Other (See Comments)    headaches  . Codeine   . Benadryl [Diphenhydramine] Rash    Topical only, makes rash worse  . Penicillins Nausea And Vomiting and Rash    Family History  Problem Relation Age of Onset  . Arthritis Mother   . Colon cancer Mother 16  . Heart disease Father   . Parkinson's disease Father   . Hypertension Brother   . Cancer Maternal Grandmother   . Heart disease Paternal Grandmother   . Arthritis Paternal Grandmother     History   Social History  . Marital Status: Married    Spouse Name: N/A    Number of Children: N/A  . Years of Education: N/A   Occupational History  . Not on file.  Social History Main Topics  . Smoking status: Never Smoker   . Smokeless tobacco: Never Used  . Alcohol Use: No  . Drug Use: No  . Sexual Activity: Yes    Birth Control/ Protection: None   Other Topics Concern  . Not on file   Social History Narrative  . No narrative on file     Constitutional: Denies fever, malaise, fatigue, headache or abrupt weight changes.  Musculoskeletal: Pt reports left ankle pain. Denies decrease in range of motion, difficulty with gait, muscle pain.  Skin: Pt reports toenail fungus. Denies rashes, lesions or ulcercations.    No other specific complaints in a complete review of systems (except as listed in HPI above).  Objective:   Physical Exam   BP 120/76  Pulse 74  Temp(Src) 97.9 F (36.6 C) (Oral)  Wt 214 lb (97.07 kg)  SpO2 98% Wt Readings from Last 3 Encounters:  05/18/14 214 lb (97.07 kg)  04/11/14 219 lb (99.338 kg)  03/28/14 219 lb (99.338 kg)    General: Appears her stated age, well  developed, well nourished in NAD. Skin: Warm, dry and intact. Toenail fungus noted of left big toe. No cellulitis noted. Cardiovascular: Normal rate and rhythm. S1,S2 noted.  No murmur, rubs or gallops noted. No JVD or BLE edema. No carotid bruits noted. Pulmonary/Chest: Normal effort and positive vesicular breath sounds. No respiratory distress. No wheezes, rales or ronchi noted.  Musculoskeletal: Normal range of motion of the left ankle. No signs of joint swelling. No difficulty with gait.    BMET    Component Value Date/Time   NA 140 01/16/2014 1614   K 3.8 01/16/2014 1614   CL 106 01/16/2014 1614   CO2 27 01/16/2014 1614   GLUCOSE 99 01/16/2014 1614   BUN 10 01/16/2014 1614   CREATININE 0.9 01/16/2014 1614   CALCIUM 9.3 01/16/2014 1614    Lipid Panel     Component Value Date/Time   CHOL 129 04/27/2014 0936   TRIG 207.0* 04/27/2014 0936   HDL 31.30* 04/27/2014 0936   CHOLHDL 4 04/27/2014 0936   VLDL 41.4* 04/27/2014 0936   LDLCALC 56 04/27/2014 0936    CBC    Component Value Date/Time   WBC 7.7 01/16/2014 1614   RBC 4.68 01/16/2014 1614   HGB 13.3 01/16/2014 1614   HCT 40.8 01/16/2014 1614   PLT 356.0 01/16/2014 1614   MCV 87.1 01/16/2014 1614   MCHC 32.7 01/16/2014 1614   RDW 13.6 01/16/2014 1614    Hgb A1C Lab Results  Component Value Date   HGBA1C 5.7 01/16/2014        Assessment & Plan:   Left ankle pain:  Likely due to standing for long hours at work Elevate your leg today Try Ibuprofen OTC  RTC as needed or if symptoms persist or worsen

## 2014-05-18 NOTE — Assessment & Plan Note (Addendum)
Has failed topical therapy She wants to try Jublia- new topical RX, will call in today Discussed use of oral medication, benefits vs risks Will check CMET today

## 2014-05-26 ENCOUNTER — Other Ambulatory Visit: Payer: Self-pay | Admitting: Internal Medicine

## 2014-05-26 DIAGNOSIS — E785 Hyperlipidemia, unspecified: Secondary | ICD-10-CM

## 2014-10-10 ENCOUNTER — Other Ambulatory Visit: Payer: Self-pay | Admitting: Hematology and Oncology

## 2014-11-27 ENCOUNTER — Other Ambulatory Visit: Payer: Self-pay | Admitting: Internal Medicine

## 2014-12-04 ENCOUNTER — Ambulatory Visit (INDEPENDENT_AMBULATORY_CARE_PROVIDER_SITE_OTHER): Payer: 59 | Admitting: Internal Medicine

## 2014-12-04 ENCOUNTER — Encounter: Payer: Self-pay | Admitting: Internal Medicine

## 2014-12-04 VITALS — BP 122/84 | HR 81 | Temp 98.2°F | Wt 211.0 lb

## 2014-12-04 DIAGNOSIS — G4700 Insomnia, unspecified: Secondary | ICD-10-CM

## 2014-12-04 DIAGNOSIS — E785 Hyperlipidemia, unspecified: Secondary | ICD-10-CM

## 2014-12-04 DIAGNOSIS — G43819 Other migraine, intractable, without status migrainosus: Secondary | ICD-10-CM

## 2014-12-04 DIAGNOSIS — M546 Pain in thoracic spine: Secondary | ICD-10-CM

## 2014-12-04 DIAGNOSIS — E669 Obesity, unspecified: Secondary | ICD-10-CM

## 2014-12-04 DIAGNOSIS — F32A Depression, unspecified: Secondary | ICD-10-CM

## 2014-12-04 DIAGNOSIS — B351 Tinea unguium: Secondary | ICD-10-CM

## 2014-12-04 DIAGNOSIS — F329 Major depressive disorder, single episode, unspecified: Secondary | ICD-10-CM

## 2014-12-04 DIAGNOSIS — K219 Gastro-esophageal reflux disease without esophagitis: Secondary | ICD-10-CM

## 2014-12-04 LAB — COMPREHENSIVE METABOLIC PANEL
ALBUMIN: 4.2 g/dL (ref 3.5–5.2)
ALT: 23 U/L (ref 0–35)
AST: 22 U/L (ref 0–37)
Alkaline Phosphatase: 94 U/L (ref 39–117)
BILIRUBIN TOTAL: 0.3 mg/dL (ref 0.2–1.2)
BUN: 14 mg/dL (ref 6–23)
CALCIUM: 8.9 mg/dL (ref 8.4–10.5)
CHLORIDE: 109 meq/L (ref 96–112)
CO2: 24 meq/L (ref 19–32)
Creatinine, Ser: 0.9 mg/dL (ref 0.4–1.2)
GFR: 70.18 mL/min (ref >60.00)
Glucose, Bld: 101 mg/dL — ABNORMAL HIGH (ref 70–99)
POTASSIUM: 4 meq/L (ref 3.5–5.1)
SODIUM: 139 meq/L (ref 135–145)
Total Protein: 7.2 g/dL (ref 6.0–8.3)

## 2014-12-04 LAB — CBC
HEMATOCRIT: 43.1 % (ref 36.0–46.0)
HEMOGLOBIN: 14 g/dL (ref 12.0–15.0)
MCHC: 32.5 g/dL (ref 30.0–36.0)
MCV: 87.5 fl (ref 78.0–100.0)
PLATELETS: 304 10*3/uL (ref 150.0–400.0)
RBC: 4.93 Mil/uL (ref 3.87–5.11)
RDW: 13.5 % (ref 11.5–15.5)
WBC: 9.9 10*3/uL (ref 4.0–10.5)

## 2014-12-04 MED ORDER — RIZATRIPTAN BENZOATE 5 MG PO TABS: 5.0000 mg | ORAL_TABLET | ORAL | Status: DC | PRN

## 2014-12-04 MED ORDER — BUPROPION HCL ER (XL) 150 MG PO TB24: 150.0000 mg | ORAL_TABLET | Freq: Every day | ORAL | Status: AC

## 2014-12-04 MED ORDER — TRAMADOL HCL 50 MG PO TABS: 50.0000 mg | ORAL_TABLET | Freq: Three times a day (TID) | ORAL | Status: DC | PRN

## 2014-12-04 MED ORDER — ROSUVASTATIN CALCIUM 10 MG PO TABS: 10.0000 mg | ORAL_TABLET | Freq: Every day | ORAL | Status: DC

## 2014-12-04 MED ORDER — PAROXETINE HCL 30 MG PO TABS: 30.0000 mg | ORAL_TABLET | Freq: Every day | ORAL | Status: DC

## 2014-12-04 MED ORDER — METHOCARBAMOL 500 MG PO TABS: 500.0000 mg | ORAL_TABLET | Freq: Three times a day (TID) | ORAL | Status: DC | PRN

## 2014-12-04 MED ORDER — AMITRIPTYLINE HCL 75 MG PO TABS: 75.0000 mg | ORAL_TABLET | Freq: Every day | ORAL | Status: DC

## 2014-12-04 MED ORDER — EFINACONAZOLE 10 % EX SOLN
1.0000 | Freq: Every day | CUTANEOUS | Status: DC
Start: 2014-12-04 — End: 2020-10-08

## 2014-12-04 NOTE — Progress Notes (Signed)
Pre visit review using our clinic review tool, if applicable. No additional management support is needed unless otherwise documented below in the visit note. 

## 2014-12-04 NOTE — Assessment & Plan Note (Signed)
Well controlled on Paxil and Wellbutrin Medications refilled today

## 2014-12-04 NOTE — Patient Instructions (Signed)
Corns and Calluses A thickening of the skin layer (usually over bony areas, such as toe joints) is known as a corn. Two types of corns exist: hard corns and soft corns. Calluses are painless areas of skin thickening that are caused by repeated pressure or irritation. Corns tend to affect toe joints and the skin between the toes; whereas, a callus can appear on any part of the body (especially the hands, feet, or knees).  SYMPTOMS   Corn:  Presence of a small (1/8 to 3/8 inch [3 to 10 mm in diameter]), painful bump on the side or over the joint of a toe.  Hard corns are more common on the outer portion of the little (fifth) toe at the joint.  Soft corns are more common between bony bumps (prominences), usually between the fourth and fifth toes or between the second and third toes.  Callus:  A rough, thickened area of skin that appears after repeated pressure or irritation. CAUSES  The purpose of corns and calluses is to protect an area of skin from injury caused by repeated irritation (rubbing or squeezing). The presence of pressure causes the skin cells to grow at a faster rate than the cells of unaffected areas. This leads to an overgrowth (corn or callus). As apposed to hard corns, soft corns tend to develop between toes, because there is more moisture. Soft corns are often the result of prolonged shoe wear, which leads to increased perspiration and moisture.  RISK INCREASES WITH:  Shoes that are too tight.  Occupations or sports that involve repetitive pressure on the hands (racquetball and baseball) or sudden stops on hard surfaces (track and tennis).  Sports that require the athlete to wear shoes, perspire, or wear clothing or protective gear that causes the production of heat and friction. PREVENTION  Properly fitted shoes and equipment.  Modify activities to prevent constant pressure on specific areas of skin.  If possible, wear padding over areas of skin that are exposed to  repeated pressure or irritation.  Keep the area between the toes dry (with powder or by removing shoes often).  Relieve shoe pressure by stretching the areas of the shoe that cause the pressure and or use ointments to soften leather shoes. PROGNOSIS  Corns and calluses typically subside if the activity that causes them is eliminated. Recovery may take up to 3 weeks. Recurrence is likely even with treatment if the cause is not removed.  RELATED COMPLICATIONS  If one overcompensates in an attempt to avoid pain, he or she may experience pain in other areas due to the changes in body movements (mechanics). TREATMENT  The best way to treat corns and calluses is to remove the source of pressure. Corn and callus pads may be helpful in reducing pressure on the affected skin. For soft corns, try to keep the affected area dry. If you cannot find shoes that fit properly, a shoe repair shop may be able to alter your shoes to reduce pressure. Occasionally a cushion for the bottom of the foot (metatarsal bar) worn within the shoe may relieve pressure on corns or calluses of the foot. For calluses, you may be able to peel or rub the thickened area with a pumice stone, sandstone, callus file, or with sandpaper to remove the callus; wetting the affected area may make this process more effective. Do not cut the corn or callus with a razor or knife. If the corn or callus must be removed, then a medically trained person should perform   the procedure. After peeling away the upper layers of a corn once or twice a day, it may be recommended to apply a non-prescription 5% to 10% salicylic ointment and cover the area with a bandage. It very uncommon to have the bony bumps (at toe joints) surgically removed. MEDICATION   If pain medication is necessary, nonsteroidal anti-inflammatory medications, such as aspirin and ibuprofen, or other minor pain relievers, such as acetaminophen, are often recommended. Contact your caregiver  immediately if any bleeding, stomach upset, or signs of an allergic reaction occur.  Topical salicylic ointments (5% to 10%) may be of benefit.  Prescription pain medications may be given by a caregiver. Use only as directed and only as much as you need.  Soak the foot for 20 minutes, twice a day, in a gallon of warm water. This may help to soften corns and calluses. Care should be taken to thoroughly dry the foot, especially between the toes, after soaking. SEEK MEDICAL CARE IF:   Symptoms get worse or do not improve in 2 weeks despite treatment.  Any signs of infection develop, including redness, swelling, increased pain or tenderness, or increased warmth around the corn or callus.  New, unexplained symptoms develop (drugs used in treatment may produce side effects). Document Released: 11/10/2005 Document Revised: 02/02/2012 Document Reviewed: 02/22/2009 Exit[employer] Patient Information 2015 [employer]. This information is not intended to replace advice given to you by your health care provider. Make sure you discuss any questions you have with your health care provider.  

## 2014-12-04 NOTE — Assessment & Plan Note (Signed)
Currently not well controlled Will refill the Ultram and Maxalt for now She will continue the Topomax until she follows up with her neurologist

## 2014-12-04 NOTE — Assessment & Plan Note (Signed)
Has improved with Jublia Will refill today

## 2014-12-04 NOTE — Assessment & Plan Note (Signed)
Well controlled on Prilosec Will check CMET today RX refilled today

## 2014-12-04 NOTE — Assessment & Plan Note (Signed)
Improved with amitriptyline R/T her back pain- advised her not to take with the Robaxin

## 2014-12-04 NOTE — Assessment & Plan Note (Signed)
Will repeat CMET and Lipid profile today Will adjust Crestor if needed RX will be refilled after labs are back Encouraged her to continue low fat diet

## 2014-12-04 NOTE — Progress Notes (Signed)
Subjective:    Patient ID: Mary Glass, female    DOB: 09/16/1956, 59 y.o.   MRN: [id number]  HPI  Pt presents to the clinic today with c/o right shoulder pain. She reports this started a few years ago but just seems to be getting worse over the last few weeks. She has noticed some numbness in her right arm. She denies any weakness in that hand. She denies injury to the area. She is right handed. She has not tried anything OTC for this. She is concerned that her shoulder pain may be due to her large breasts. She has has pain in her upper back as well. She was given amitriptyline to help with her back pain at night.  Additionally, she c/o a callous on the ball of her left foot. She reports it does feel like it is burning at times. She has never had issues like this before. She is standing on her feet a lot a work. She wears shoes that are well cushioned. She has tried filing her feet with a foot file but it has not helped.  Anxiety and Depression: Her symptoms are well controlled on Paxil and Wellburtin. She denies SI/HI. She will need a refill today.  Migraines: These seem to be getting worse. She reports the Topomax has not been as effective as she hoped it would have been. She is taking the Maxalt for severe migraines. If she wakes up with a headache, the tramadol is effective. She does have an upcoming followup with Dr. Melton Alar, her neurologist. She will need a refill of the Tramadol and Maxalt today.  GERD: She denies breakthrough symptoms on Prilosec.  HLD: She denies myalgias on Crestor. She has lipids 6 months ago which showed her LDL was at goal.  Toenail fungus: She reports her toe has almost completely cleared up with Jublia. She would like another refill today.  Review of Systems      Past Medical History  Diagnosis Date  . Allergy   . Depression   . Anxiety   . Migraine   . Arthritis   . GERD (gastroesophageal reflux disease)   . Hyperlipidemia     Current  Outpatient Prescriptions  Medication Sig Dispense Refill  . amitriptyline (ELAVIL) 75 MG tablet Take 1 tablet (75 mg total) by mouth at bedtime. 90 tablet 3  . buPROPion (WELLBUTRIN XL) 150 MG 24 hr tablet Take 1 tablet (150 mg total) by mouth daily. PATIENT NEEDS OFFICE VISIT FOR ADDITIONAL REFILLS 90 tablet 3  . cetirizine (ZYRTEC) 10 MG tablet Take 10 mg by mouth daily.    . Efinaconazole (JUBLIA) 10 % SOLN Apply 1 application topically daily. 8 mL 5  . Glucosamine-Chondroitin (GLUCOSAMINE CHONDR COMPLEX PO) Take 1 capsule by mouth daily.    Marland Kitchen KRILL OIL PO Take 1 capsule by mouth 2 (two) times daily.    Marland Kitchen omeprazole (PRILOSEC) 20 MG capsule TAKE 1 CAPSULE (20 MG TOTAL) BY MOUTH 2 (TWO) TIMES DAILY BEFORE A MEAL. 180 capsule 0  . PARoxetine (PAXIL) 30 MG tablet Take 1 tablet (30 mg total) by mouth daily. PATIENT NEEDS OFFICE VISIT FOR ADDITIONAL REFILLS 90 tablet 3  . rizatriptan (MAXALT) 5 MG tablet Take 1 tablet (5 mg total) by mouth as needed for migraine. May repeat in 2 hours if needed 30 tablet 1  . rosuvastatin (CRESTOR) 10 MG tablet Take 1 tablet (10 mg total) by mouth daily. 30 tablet 5  . topiramate (TOPAMAX) 25 MG tablet Take 100  mg by mouth at bedtime as needed. 1 tablet hs, may increase up to 6 tablets per day.    . traMADol (ULTRAM) 50 MG tablet Take 1 tablet (50 mg total) by mouth every 8 (eight) hours as needed. 20 tablet 0   No current facility-administered medications for this visit.    Allergies  Allergen Reactions  . Ambien [Zolpidem Tartrate] Other (See Comments)    headaches  . Codeine   . Benadryl [Diphenhydramine] Rash    Topical only, makes rash worse  . Penicillins Nausea And Vomiting and Rash    Family History  Problem Relation Age of Onset  . Arthritis Mother   . Colon cancer Mother 51  . Heart disease Father   . Parkinson's disease Father   . Hypertension Brother   . Cancer Maternal Grandmother   . Heart disease Paternal Grandmother   . Arthritis  Paternal Grandmother     History   Social History  . Marital Status: Married    Spouse Name: N/A    Number of Children: N/A  . Years of Education: N/A   Occupational History  . Not on file.   Social History Main Topics  . Smoking status: Never Smoker   . Smokeless tobacco: Never Used  . Alcohol Use: No  . Drug Use: No  . Sexual Activity: Yes    Birth Control/ Protection: None   Other Topics Concern  . Not on file   Social History Narrative     Constitutional: Pt reports headache. Denies fever, malaise, fatigue or abrupt weight changes.  HEENT: Denies eye pain, eye redness, ear pain, ringing in the ears, wax buildup, runny nose, nasal congestion, bloody nose, or sore throat. Respiratory: Denies difficulty breathing, shortness of breath, cough or sputum production.   Cardiovascular: Denies chest pain, chest tightness, palpitations or swelling in the hands or feet.  Gastrointestinal: Pt reports reflux. Denies abdominal pain, bloating, constipation, diarrhea or blood in the stool.  Musculoskeletal: Pt reports right shoulder pain and upper back pain. Denies difficulty with gait or joint swelling.  Skin: Pt reports callous of left foot. Denies redness, rashes or ulcercations.  Neurological: Denies dizziness, difficulty with memory, difficulty with speech or problems with balance and coordination.  Psych: Pt reports history of anxiety and depression. Denies SI/HI.  No other specific complaints in a complete review of systems (except as listed in HPI above).   Objective:   Physical Exam  BP 122/84 mmHg  Pulse 81  Temp(Src) 98.2 F (36.8 C) (Oral)  Wt 211 lb (95.709 kg) Wt Readings from Last 3 Encounters:  12/04/14 211 lb (95.709 kg)  05/18/14 214 lb (97.07 kg)  04/11/14 219 lb (99.338 kg)    General: Appears her stated age, obese in NAD. Skin: Warm, dry and intact. 3 cm x 3 cm round callous to ball of left foot. Fungal infection noted of toenails- but improved. HEENT:  Head: normal shape and size; Eyes: sclera white, no icterus, conjunctiva pink Cardiovascular: Normal rate and rhythm. S1,S2 noted.  No murmur, rubs or gallops noted.  Pulmonary/Chest: Normal effort and positive vesicular breath sounds. No respiratory distress. No wheezes, rales or ronchi noted.  Abdomen: Soft and nontender. Normal bowel sounds, no bruits noted. No distention or masses noted. Liver, spleen and kidneys non palpable. Musculoskeletal: Pain with internal and external rotation of the right shoulder. No crepitus noted with ROM. Pain with palpation of the right AC joint. Muscles very tense in the trapezius area. Pian with palpation of  the cervical spine. Normal flexion and extension of the cervical spine. Strength 5/5 BUE. Negative drop can test.  Neurological: Alert and oriented. Sensation intact to BUE. Psychiatric: Mood and affect normal. Behavior is normal. Judgment and thought content normal.     BMET    Component Value Date/Time   NA 139 05/18/2014 1006   K 4.0 05/18/2014 1006   CL 109 05/18/2014 1006   CO2 24 05/18/2014 1006   GLUCOSE 108* 05/18/2014 1006   BUN 13 05/18/2014 1006   CREATININE 0.9 05/18/2014 1006   CALCIUM 9.0 05/18/2014 1006    Lipid Panel     Component Value Date/Time   CHOL 133 05/18/2014 1006   TRIG 224.0* 05/18/2014 1006   HDL 33.60* 05/18/2014 1006   CHOLHDL 4 05/18/2014 1006   VLDL 44.8* 05/18/2014 1006   LDLCALC 55 05/18/2014 1006    CBC    Component Value Date/Time   WBC 7.7 01/16/2014 1614   RBC 4.68 01/16/2014 1614   HGB 13.3 01/16/2014 1614   HCT 40.8 01/16/2014 1614   PLT 356.0 01/16/2014 1614   MCV 87.1 01/16/2014 1614   MCHC 32.7 01/16/2014 1614   RDW 13.6 01/16/2014 1614    Hgb A1C Lab Results  Component Value Date   HGBA1C 5.7 01/16/2014         Assessment & Plan:   Callous, left foot:  Try Dr. Felicie Morn Callous Pad's with salicylic acid Apply 1 pad, leave on for 48 hours, may repeat every 48 hours x 14  days  Right shoulder pain:  ? If there is some radiculopathy associated with the muscle tension Try the Ultram TID prn eRx for Robaxin TID prn- do not take the amitriptyline at the same time as the Robaxin  RTC in 6 months or sooner if needed

## 2014-12-04 NOTE — Assessment & Plan Note (Signed)
Encouraged her to work on diet and exercise 

## 2014-12-05 LAB — LIPID PANEL
CHOL/HDL RATIO: 6
CHOLESTEROL: 167 mg/dL (ref 0–200)
HDL: 30.1 mg/dL — ABNORMAL LOW (ref >39.00)
NonHDL: 136.9
Triglycerides: 340 mg/dL — ABNORMAL HIGH (ref 0.0–149.0)
VLDL: 68 mg/dL — AB (ref 0.0–40.0)

## 2014-12-05 LAB — LDL CHOLESTEROL, DIRECT: LDL DIRECT: 90.6 mg/dL

## 2014-12-11 ENCOUNTER — Telehealth: Payer: Self-pay

## 2014-12-11 NOTE — Telephone Encounter (Signed)
Can you call pt and ask if she would rather try topical or oral since the Jublia is not covered.

## 2014-12-11 NOTE — Telephone Encounter (Signed)
Left message on voicemail.

## 2014-12-11 NOTE — Telephone Encounter (Signed)
Received notification via fax from insurance--cheaper alternative will be Itraconazole or terbinfine--please advise if okay to change --please send in or wor would you like me to proceed with PA--

## 2014-12-12 NOTE — Telephone Encounter (Signed)
Pt left v/m returning call and request cb. 

## 2014-12-13 NOTE — Telephone Encounter (Signed)
Pt let /vm requesting cb.

## 2014-12-14 ENCOUNTER — Other Ambulatory Visit: Payer: Self-pay | Admitting: Internal Medicine

## 2014-12-14 DIAGNOSIS — Z5181 Encounter for therapeutic drug level monitoring: Secondary | ICD-10-CM

## 2014-12-14 MED ORDER — TERBINAFINE HCL 250 MG PO TABS: 250.0000 mg | ORAL_TABLET | Freq: Every day | ORAL | Status: DC

## 2014-12-14 NOTE — Telephone Encounter (Signed)
Pt states she will take an oral medication--please send to [facility]

## 2015-02-08 ENCOUNTER — Other Ambulatory Visit: Payer: Self-pay | Admitting: Hematology and Oncology

## 2015-02-19 ENCOUNTER — Other Ambulatory Visit: Payer: Self-pay | Admitting: Internal Medicine

## 2015-02-25 ENCOUNTER — Other Ambulatory Visit: Payer: Self-pay | Admitting: Internal Medicine

## 2015-06-04 ENCOUNTER — Ambulatory Visit: Payer: 59 | Admitting: Internal Medicine

## 2015-06-18 ENCOUNTER — Telehealth: Payer: Self-pay

## 2015-06-18 ENCOUNTER — Encounter: Payer: Self-pay | Admitting: Internal Medicine

## 2015-06-18 NOTE — Telephone Encounter (Signed)
Patient declined any information on scheduling a Mammogram. 

## 2015-07-27 ENCOUNTER — Other Ambulatory Visit: Payer: Self-pay | Admitting: Internal Medicine

## 2015-07-30 ENCOUNTER — Other Ambulatory Visit: Payer: Self-pay | Admitting: Internal Medicine

## 2015-12-24 ENCOUNTER — Other Ambulatory Visit: Payer: Self-pay | Admitting: Internal Medicine

## 2015-12-28 ENCOUNTER — Other Ambulatory Visit: Payer: Self-pay | Admitting: Internal Medicine

## 2016-02-26 ENCOUNTER — Other Ambulatory Visit: Payer: Self-pay | Admitting: Internal Medicine

## 2016-05-23 ENCOUNTER — Other Ambulatory Visit: Payer: Self-pay | Admitting: Internal Medicine

## 2016-06-03 ENCOUNTER — Other Ambulatory Visit: Payer: Self-pay | Admitting: Internal Medicine

## 2016-06-03 NOTE — Telephone Encounter (Signed)
Letter mailed to pt advising her to schedule annual exam for further refills

## 2016-06-28 ENCOUNTER — Other Ambulatory Visit: Payer: Self-pay | Admitting: Internal Medicine

## 2016-07-02 ENCOUNTER — Other Ambulatory Visit: Payer: Self-pay | Admitting: Internal Medicine

## 2016-07-04 ENCOUNTER — Other Ambulatory Visit: Payer: Self-pay | Admitting: Internal Medicine

## 2017-02-03 ENCOUNTER — Encounter: Payer: Self-pay | Admitting: *Deleted

## 2017-02-19 ENCOUNTER — Encounter: Payer: Self-pay | Admitting: Internal Medicine

## 2020-10-07 ENCOUNTER — Encounter (HOSPITAL_COMMUNITY): Payer: Self-pay

## 2020-10-07 ENCOUNTER — Other Ambulatory Visit: Payer: Self-pay

## 2020-10-07 ENCOUNTER — Inpatient Hospital Stay (HOSPITAL_COMMUNITY)
Admission: EM | Admit: 2020-10-07 | Discharge: 2020-10-09 | DRG: 287 | Disposition: A | Discharge status: Home / Self Care | Payer: BC Managed Care – PPO | Attending: Family Medicine | Admitting: Family Medicine

## 2020-10-07 ENCOUNTER — Emergency Department (HOSPITAL_COMMUNITY): Payer: BC Managed Care – PPO

## 2020-10-07 ENCOUNTER — Ambulatory Visit (HOSPITAL_COMMUNITY)
Admission: EM | Admit: 2020-10-07 | Discharge: 2020-10-07 | Disposition: A | Discharge status: Home / Self Care | Payer: BC Managed Care – PPO | Attending: Emergency Medicine | Admitting: Emergency Medicine

## 2020-10-07 ENCOUNTER — Encounter (HOSPITAL_COMMUNITY): Payer: Self-pay | Admitting: Emergency Medicine

## 2020-10-07 DIAGNOSIS — Z885 Allergy status to narcotic agent status: Secondary | ICD-10-CM

## 2020-10-07 DIAGNOSIS — Z91011 Allergy to milk products: Secondary | ICD-10-CM

## 2020-10-07 DIAGNOSIS — G43909 Migraine, unspecified, not intractable, without status migrainosus: Secondary | ICD-10-CM | POA: Diagnosis present

## 2020-10-07 DIAGNOSIS — Z79899 Other long term (current) drug therapy: Secondary | ICD-10-CM

## 2020-10-07 DIAGNOSIS — R079 Chest pain, unspecified: Secondary | ICD-10-CM | POA: Diagnosis present

## 2020-10-07 DIAGNOSIS — F410 Panic disorder [episodic paroxysmal anxiety] without agoraphobia: Secondary | ICD-10-CM | POA: Diagnosis present

## 2020-10-07 DIAGNOSIS — I200 Unstable angina: Principal | ICD-10-CM

## 2020-10-07 DIAGNOSIS — Z66 Do not resuscitate: Secondary | ICD-10-CM | POA: Diagnosis present

## 2020-10-07 DIAGNOSIS — M940 Chondrocostal junction syndrome [Tietze]: Secondary | ICD-10-CM | POA: Diagnosis present

## 2020-10-07 DIAGNOSIS — Z8261 Family history of arthritis: Secondary | ICD-10-CM

## 2020-10-07 DIAGNOSIS — E785 Hyperlipidemia, unspecified: Secondary | ICD-10-CM | POA: Diagnosis present

## 2020-10-07 DIAGNOSIS — R9431 Abnormal electrocardiogram [ECG] [EKG]: Secondary | ICD-10-CM

## 2020-10-07 DIAGNOSIS — Z6836 Body mass index (BMI) 36.0-36.9, adult: Secondary | ICD-10-CM

## 2020-10-07 DIAGNOSIS — F32A Depression, unspecified: Secondary | ICD-10-CM | POA: Diagnosis present

## 2020-10-07 DIAGNOSIS — Z820 Family history of epilepsy and other diseases of the nervous system: Secondary | ICD-10-CM

## 2020-10-07 DIAGNOSIS — E669 Obesity, unspecified: Secondary | ICD-10-CM | POA: Diagnosis present

## 2020-10-07 DIAGNOSIS — Z888 Allergy status to other drugs, medicaments and biological substances status: Secondary | ICD-10-CM

## 2020-10-07 DIAGNOSIS — Z880 Allergy status to penicillin: Secondary | ICD-10-CM

## 2020-10-07 DIAGNOSIS — Z23 Encounter for immunization: Secondary | ICD-10-CM

## 2020-10-07 DIAGNOSIS — I447 Left bundle-branch block, unspecified: Secondary | ICD-10-CM | POA: Diagnosis present

## 2020-10-07 DIAGNOSIS — Z800 Family history of malignant neoplasm of digestive organs: Secondary | ICD-10-CM

## 2020-10-07 DIAGNOSIS — K219 Gastro-esophageal reflux disease without esophagitis: Secondary | ICD-10-CM | POA: Diagnosis present

## 2020-10-07 DIAGNOSIS — Z8249 Family history of ischemic heart disease and other diseases of the circulatory system: Secondary | ICD-10-CM

## 2020-10-07 DIAGNOSIS — Z20822 Contact with and (suspected) exposure to covid-19: Secondary | ICD-10-CM | POA: Diagnosis present

## 2020-10-07 LAB — CBC
HCT: 42.2 % (ref 36.0–46.0)
Hemoglobin: 13.1 g/dL (ref 12.0–15.0)
MCH: 26 pg (ref 26.0–34.0)
MCHC: 31 g/dL (ref 30.0–36.0)
MCV: 83.9 fL (ref 80.0–100.0)
Platelets: 370 10*3/uL (ref 150–400)
RBC: 5.03 MIL/uL (ref 3.87–5.11)
RDW: 14.6 % (ref 11.5–15.5)
WBC: 12.8 10*3/uL — ABNORMAL HIGH (ref 4.0–10.5)
nRBC: 0 % (ref 0.0–0.2)

## 2020-10-07 LAB — BASIC METABOLIC PANEL
Anion gap: 13 (ref 5–15)
BUN: 14 mg/dL (ref 8–23)
CO2: 21 mmol/L — ABNORMAL LOW (ref 22–32)
Calcium: 9.2 mg/dL (ref 8.9–10.3)
Chloride: 104 mmol/L (ref 98–111)
Creatinine, Ser: 1.01 mg/dL — ABNORMAL HIGH (ref 0.44–1.00)
GFR, Estimated: >60 mL/min (ref >60)
Glucose, Bld: 101 mg/dL — ABNORMAL HIGH (ref 70–99)
Potassium: 4 mmol/L (ref 3.5–5.1)
Sodium: 138 mmol/L (ref 135–145)

## 2020-10-07 LAB — TROPONIN I (HIGH SENSITIVITY)
Troponin I (High Sensitivity): 6 ng/L (ref <18)
Troponin I (High Sensitivity): 6 ng/L (ref <18)

## 2020-10-07 MED ORDER — ASPIRIN 81 MG PO CHEW
324.0000 mg | CHEWABLE_TABLET | Freq: Once | ORAL | Status: AC
  Administered 2020-10-07: 324 mg via ORAL

## 2020-10-07 MED ORDER — MORPHINE SULFATE (PF) 4 MG/ML IV SOLN
4.0000 mg | Freq: Once | INTRAVENOUS | Status: AC
  Administered 2020-10-07: 4 mg via INTRAVENOUS
  Filled 2020-10-07: qty 1

## 2020-10-07 MED ORDER — NITROGLYCERIN 0.4 MG SL SUBL
0.4000 mg | SUBLINGUAL_TABLET | SUBLINGUAL | Status: DC | PRN
  Filled 2020-10-07: qty 1

## 2020-10-07 MED ORDER — ASPIRIN 81 MG PO CHEW
CHEWABLE_TABLET | ORAL | Status: AC
  Filled 2020-10-07: qty 4

## 2020-10-07 MED ORDER — HEPARIN (PORCINE) 25000 UT/250ML-% IV SOLN
1600.0000 [IU]/h | INTRAVENOUS | Status: DC
  Administered 2020-10-07: 1000 [IU]/h via INTRAVENOUS
  Administered 2020-10-08: 1500 [IU]/h via INTRAVENOUS
  Administered 2020-10-09: 1600 [IU]/h via INTRAVENOUS
  Filled 2020-10-07 (×3): qty 250

## 2020-10-07 MED ORDER — HEPARIN BOLUS VIA INFUSION
4000.0000 [IU] | Freq: Once | INTRAVENOUS | Status: AC
  Administered 2020-10-07: 4000 [IU] via INTRAVENOUS
  Filled 2020-10-07: qty 4000

## 2020-10-07 NOTE — Progress Notes (Signed)
ANTICOAGULATION CONSULT NOTE - Initial Consult  Pharmacy Consult for heparin Indication: chest pain/ACS  Allergies  Allergen Reactions  . Ambien [Zolpidem Tartrate] Other (See Comments)    headaches  . Codeine   . Benadryl [Diphenhydramine] Rash    Topical only, makes rash worse  . Penicillins Nausea And Vomiting and Rash    Patient Measurements: Height: 5' 5.5" (166.4 cm) Weight: 102.5 kg (226 lb) IBW/kg (Calculated) : 58.15 Heparin Dosing Weight: 81.6 kg  Vital Signs: Temp: 98.5 F (36.9 C) (11/14 2050) Temp Source: Oral (11/14 2050) BP: 107/66 (11/14 2100) Pulse Rate: 77 (11/14 2100)  Labs: Recent Labs    10/07/20 1841  HGB 13.1  HCT 42.2  PLT 370  CREATININE 1.01*  TROPONINIHS 6    Estimated Creatinine Clearance: 68.3 mL/min (A) (by C-G formula based on SCr of 1.01 mg/dL (H)).   Medical History: Past Medical History:  Diagnosis Date  . Allergy   . Anxiety   . Arthritis   . Depression   . GERD (gastroesophageal reflux disease)   . Hyperlipidemia   . Migraine     Medications:  See medication history  Assessment: 64 yo lady to start heparin for CP.  She was not on anticoagulation PTA.  Hg 13.1, PTLC 370 Goal of Therapy:  Heparin level 0.3-0.7 units/ml Monitor platelets by anticoagulation protocol: Yes   Plan:  Heparin bolus 4000 units and drip at 1000 units/hr Check heparin level ~ 6 hours after start Daily HL and CBC while on heparin Monitor for bleeding complications  Rama Sorci Poteet 10/07/2020,10:20 PM

## 2020-10-07 NOTE — H&P (Signed)
History and Physical    Anslie Spadafora XFG:[id number] DOB: [birth year] DOA: 10/07/2020  PCP: [facility]  Patient coming from: Home via EMS/urgent care  I have personally briefly reviewed patient's old medical records in [place]  Chief Complaint: Chest pain  HPI: Mary Glass is a 64 y.o. female with medical history significant for hyperlipidemia, GERD, depression/anxiety, and migraine disorder who presents to the ED for evaluation of chest pain.  Patient states she had new onset of central sternal chest pain beginning earlier on 11/14.  She is not sure what she was doing but thinks she might of been drying her hair when symptoms began.  Pain feels as if it is going through to her back.  She describes as a stabbing sensation.  It has been fairly constant all day but worsened intermittently with activity.  She had an episode of nausea earlier without emesis.  She denies any radiation of the pain to her neck, jaw, or down her arms.  She has not had any palpitations, dyspnea, cough, diaphoresis, heartburn/reflux, lower extremity swelling.  She denies any abdominal pain, dysuria or diarrhea.  She reports a similar episode nearly 30 years ago which was diagnosed as costochondritis.  She denies any history of tobacco use.  She denies alcohol or illicit drug use.  She says her father has a history of angina and pacemaker.  ED Course:  Initial vitals showed BP 139/69, pulse 86, RR 16, temp 98.4 Fahrenheit, SPO2 96% on room air.  Labs show WBC 12.8, hemoglobin 13.1, platelets 370,000, sodium 138, potassium 4.0, bicarb 21, BUN 14, creatinine 1.01, serum glucose 101, high-sensitivity troponin I 6.  2 view chest x-ray was negative for focal consolidation, edema, or effusion.  Few nodular densities in the left midlung zone are noted, stable compared to multiple prior studies dating back to 2005.  EKG showed sinus rhythm with LBBB and nonspecific T wave  changes leads III, aVF, V6.  EDP discussed with Dr. Einar Gip, [facility] attending on call who felt EKG was a left bundle and not significantly changed between repeat EKGs.  EDP then discussed with Dr. Terri Skains on-call cardiology for [facility] cardio who recommended medical admission for chest pain work-up and starting IV heparin.  Patient was started on heparin per pharmacy and given 4 mg IV morphine.  The hospitalist service was consulted to admit for further evaluation and management.  Review of Systems: All systems reviewed and are negative except as documented in history of present illness above.   Past Medical History:  Diagnosis Date  . Allergy   . Anxiety   . Arthritis   . Depression   . GERD (gastroesophageal reflux disease)   . Hyperlipidemia   . Migraine     Past Surgical History:  Procedure Laterality Date  . ABDOMINAL HYSTERECTOMY  1995  . FOOT SURGERY      Social History:  reports that she has never smoked. She has never used smokeless tobacco. She reports that she does not drink alcohol and does not use drugs.  Allergies  Allergen Reactions  . Ambien [Zolpidem Tartrate] Other (See Comments)    headaches  . Codeine   . Benadryl [Diphenhydramine] Rash    Topical only, makes rash worse  . Penicillins Nausea And Vomiting and Rash    Family History  Problem Relation Age of Onset  . Arthritis Mother   . Colon cancer Mother 72  . Heart disease Father   . Parkinson's disease Father   .  Hypertension Brother   . Cancer Maternal Grandmother   . Heart disease Paternal Grandmother   . Arthritis Paternal Grandmother      Prior to Admission medications   Medication Sig Start Date End Date Taking? Authorizing Provider  amitriptyline (ELAVIL) 75 MG tablet Take 1 tablet (75 mg total) by mouth at bedtime. 12/04/14   Jearld Fenton, NP  buPROPion (WELLBUTRIN XL) 150 MG 24 hr tablet Take 1 tablet (150 mg total) by mouth daily. PATIENT NEEDS OFFICE VISIT FOR ADDITIONAL REFILLS  12/04/14   Jearld Fenton, NP  cetirizine (ZYRTEC) 10 MG tablet Take 10 mg by mouth daily.    [provider]  Efinaconazole 10 % SOLN Apply 1 application topically daily. 12/04/14   Jearld Fenton, NP  Glucosamine-Chondroitin (GLUCOSAMINE CHONDR COMPLEX PO) Take 1 capsule by mouth daily.    [provider]  KRILL OIL PO Take 1 capsule by mouth 2 (two) times daily.    [provider]  methocarbamol (ROBAXIN) 500 MG tablet TAKE 1 TABLET (500 MG TOTAL) BY MOUTH EVERY 8 (EIGHT) HOURS AS NEEDED FOR MUSCLE SPASMS. 02/20/15   Jearld Fenton, NP  omeprazole (PRILOSEC) 20 MG capsule Take 1 capsule (20 mg total) by mouth 2 (two) times daily before a meal. MUST SCHEDULE ANNUAL EXAM 05/23/16   Jearld Fenton, NP  PARoxetine (PAXIL) 30 MG tablet TAKE 1 TABLET (30 MG TOTAL) BY MOUTH DAILY. MUST SCHEDULE ANNUAL PHYSICAL FOR FURTHER REFILLS 06/03/16   Jearld Fenton, NP  rizatriptan (MAXALT) 5 MG tablet Take 1 tablet (5 mg total) by mouth as needed for migraine. May repeat in 2 hours if needed 12/04/14   Jearld Fenton, NP  rosuvastatin (CRESTOR) 10 MG tablet TAKE 1 TABLET (10 MG TOTAL) BY MOUTH DAILY. MUST SCHEDULE ANNUAL PHYSICAL FOR FURTHER REFILLS 02/27/16   Jearld Fenton, NP  terbinafine (LAMISIL) 250 MG tablet Take 1 tablet (250 mg total) by mouth daily. 12/14/14   Jearld Fenton, NP  topiramate (TOPAMAX) 25 MG tablet Take 100 mg by mouth at bedtime as needed. 1 tablet hs, may increase up to 6 tablets per day.    [provider]  traMADol (ULTRAM) 50 MG tablet Take 1 tablet (50 mg total) by mouth every 8 (eight) hours as needed. 12/04/14   Jearld Fenton, NP    Physical Exam: Vitals:   10/07/20 1830 10/07/20 2050 10/07/20 2100 10/07/20 2215  BP: 139/69 103/86 107/66 136/72  Pulse: 86 82 77 80  Resp: 16 18 (!) 23 13  Temp: 98.4 F (36.9 C) 98.5 F (36.9 C)    TempSrc: Oral Oral    SpO2: 96% 100% 98% 98%  Weight: 102.5 kg     Height: 5' 5.5" (1.664 m)       Constitutional: Obese woman resting in bed with head elevated, NAD, calm, comfortable Eyes: PERRL, lids and conjunctivae normal ENMT: Mucous membranes are moist. Posterior pharynx clear of any exudate or lesions.Normal dentition.  Neck: normal, supple, no masses. Respiratory: clear to auscultation bilaterally, no wheezing, no crackles. Normal respiratory effort. No accessory muscle use.  Cardiovascular: Regular rate and rhythm, no murmurs / rubs / gallops. No extremity edema. 2+ pedal pulses. Abdomen: no tenderness, no masses palpated. No hepatosplenomegaly. Bowel sounds positive.  Musculoskeletal: Reproducible chest wall tenderness over the lower sternum and left upper chest wall area.  Left shoulder pain elicited with movement against resistance.  No clubbing / cyanosis. No joint deformity upper and lower extremities.  Good ROM, no contractures. Normal muscle tone.  Skin: no rashes, lesions, ulcers. No induration Neurologic: CN 2-12 grossly intact. Sensation intact, Strength 5/5 in all 4.  Psychiatric: Normal judgment and insight. Alert and oriented x 3. Normal mood.     Labs on Admission: I have personally reviewed following labs and imaging studies  CBC: Recent Labs  Lab 10/07/20 1841  WBC 12.8*  HGB 13.1  HCT 42.2  MCV 83.9  PLT 109   Basic Metabolic Panel: Recent Labs  Lab 10/07/20 1841  NA 138  K 4.0  CL 104  CO2 21*  GLUCOSE 101*  BUN 14  CREATININE 1.01*  CALCIUM 9.2   GFR: Estimated Creatinine Clearance: 68.3 mL/min (A) (by C-G formula based on SCr of 1.01 mg/dL (H)). Liver Function Tests: No results for input(s): AST, ALT, ALKPHOS, BILITOT, PROT, ALBUMIN in the last 168 hours. No results for input(s): LIPASE, AMYLASE in the last 168 hours. No results for input(s): AMMONIA in the last 168 hours. Coagulation Profile: No results for input(s): INR, PROTIME in the last 168 hours. Cardiac Enzymes: No results for input(s): CKTOTAL, CKMB, CKMBINDEX, TROPONINI in  the last 168 hours. BNP (last 3 results) No results for input(s): PROBNP in the last 8760 hours. HbA1C: No results for input(s): HGBA1C in the last 72 hours. CBG: No results for input(s): GLUCAP in the last 168 hours. Lipid Profile: No results for input(s): CHOL, HDL, LDLCALC, TRIG, CHOLHDL, LDLDIRECT in the last 72 hours. Thyroid Function Tests: No results for input(s): TSH, T4TOTAL, FREET4, T3FREE, THYROIDAB in the last 72 hours. Anemia Panel: No results for input(s): VITAMINB12, FOLATE, FERRITIN, TIBC, IRON, RETICCTPCT in the last 72 hours. Urine analysis: No results found for: COLORURINE, APPEARANCEUR, LABSPEC, [place], GLUCOSEU, HGBUR, BILIRUBINUR, KETONESUR, PROTEINUR, UROBILINOGEN, NITRITE, LEUKOCYTESUR  Radiological Exams on Admission: DG Chest 2 View  Result Date: 10/07/2020 CLINICAL DATA:  Left-sided chest pain with cough. EXAM: CHEST - 2 VIEW COMPARISON:  02/26/2004 FINDINGS: The heart size and mediastinal contours are within normal limits. There are few nodular densities in the left mid lung zone that are stable across multiple prior studies dating back to 2005. Otherwise, the lungs are clear. The visualized skeletal structures are unremarkable. IMPRESSION: No active cardiopulmonary disease. Electronically Signed   By: Constance Holster M.D.   On: 10/07/2020 19:04    EKG: Personally reviewed. Initial EKG showed sinus rhythm, LBBB, nonspecific T wave changes leads III, aVF, V6.  Repeat EKG showed similar findings.  No prior for comparison.  Assessment/Plan Principal Problem:   Chest pain Active Problems:   Migraine   HLD (hyperlipidemia)  Triniti Gruetzmacher is a 64 y.o. female with medical history significant for hyperlipidemia, GERD, depression/anxiety, and migraine disorder who is admitted for chest pain work-up.  Chest pain: Patient with atypical chest pain.  Symptoms are reproducible on exam.  EKG shows left bundle branch block.  Troponin is negative x2.  EDP  discussed with on-call cardiology who recommended heparin anticoagulation and will see tomorrow for further evaluation. -Continue heparin drip -Started on aspirin 81 mg daily -Continue rosuvastatin -Obtain echocardiogram -Keep n.p.o.  Hyperlipidemia: Continue rosuvastatin.  Depression/anxiety: Continue bupropion.  Migraine disorder: Continue Elavil.  DVT prophylaxis: Heparin drip Code Status: DNR, confirmed with patient Family Communication: Discussed with patient's husband at bedside Disposition Plan: From home and likely discharge to home pending chest pain work-up Consults called: Cardiology consulted by EDP Admission status:  Status is: Observation  The patient remains OBS appropriate and will d/c before 2 midnights.  Dispo: The patient is from: Home              Anticipated d/c is to: Home              Anticipated d/c date is: 1 day              Patient currently is not medically stable to d/c.     Zada Finders MD [facility]  If 7PM-7AM, please contact night-coverage [email or web address]  10/07/2020, 11:27 PM

## 2020-10-07 NOTE — ED Triage Notes (Signed)
Pt present chest pain, symptoms started today. Pt state the pain started on the left side and ends up in her back. Pt state the pain got severe a few hours ago.

## 2020-10-07 NOTE — ED Notes (Signed)
EMS at [facility]

## 2020-10-07 NOTE — ED Triage Notes (Signed)
Pt BIB [facility] from urgent care, c/o central chest pain that radiates to her back. Pain started this morning while at church. Denies shortness of breath.

## 2020-10-07 NOTE — ED Provider Notes (Signed)
[facility]    CSN: [id number] Arrival date & time: 10/07/20  1718      History   Chief Complaint Chief Complaint  Patient presents with  . Chest Pain    HPI Mary Glass is a 64 y.o. female.   Mary Glass presents with complaints of chest pain which started this morning and has persisted. Radiates to back. Worse when she is ambulatory. No arm or jaw pain. Nausea earlier, no active nausea. With activity she felt a "punching" sensation to left chest. No shortness of breath . No diaphoresis. Denies any previous similar. No cardiac history. No history of hypertension. History of anxiety, gerd, hyperlipidemia, obesity.     ROS per HPI, negative if not otherwise mentioned.      Past Medical History:  Diagnosis Date  . Allergy   . Anxiety   . Arthritis   . Depression   . GERD (gastroesophageal reflux disease)   . Hyperlipidemia   . Migraine     Patient Active Problem List   Diagnosis Date Noted  . HLD (hyperlipidemia) 05/18/2014  . Toenail fungus 02/15/2014  . Obesity (BMI 30-39.9) 01/16/2014  . Insomnia 01/16/2014  . Hot flashes 01/16/2014  . Migraine 12/27/2012  . Depression 12/27/2012  . GERD (gastroesophageal reflux disease) 12/27/2012    Past Surgical History:  Procedure Laterality Date  . ABDOMINAL HYSTERECTOMY  1995  . FOOT SURGERY      OB History   No obstetric history on file.      Home Medications    Prior to Admission medications   Medication Sig Start Date End Date Taking? Authorizing Provider  amitriptyline (ELAVIL) 75 MG tablet Take 1 tablet (75 mg total) by mouth at bedtime. 12/04/14   Jearld Fenton, NP  buPROPion (WELLBUTRIN XL) 150 MG 24 hr tablet Take 1 tablet (150 mg total) by mouth daily. PATIENT NEEDS OFFICE VISIT FOR ADDITIONAL REFILLS 12/04/14   Jearld Fenton, NP  cetirizine (ZYRTEC) 10 MG tablet Take 10 mg by mouth daily.    [provider]  Efinaconazole 10 % SOLN Apply 1 application topically  daily. 12/04/14   Jearld Fenton, NP  Glucosamine-Chondroitin (GLUCOSAMINE CHONDR COMPLEX PO) Take 1 capsule by mouth daily.    [provider]  KRILL OIL PO Take 1 capsule by mouth 2 (two) times daily.    [provider]  methocarbamol (ROBAXIN) 500 MG tablet TAKE 1 TABLET (500 MG TOTAL) BY MOUTH EVERY 8 (EIGHT) HOURS AS NEEDED FOR MUSCLE SPASMS. 02/20/15   Jearld Fenton, NP  omeprazole (PRILOSEC) 20 MG capsule Take 1 capsule (20 mg total) by mouth 2 (two) times daily before a meal. MUST SCHEDULE ANNUAL EXAM 05/23/16   Jearld Fenton, NP  PARoxetine (PAXIL) 30 MG tablet TAKE 1 TABLET (30 MG TOTAL) BY MOUTH DAILY. MUST SCHEDULE ANNUAL PHYSICAL FOR FURTHER REFILLS 06/03/16   Jearld Fenton, NP  rizatriptan (MAXALT) 5 MG tablet Take 1 tablet (5 mg total) by mouth as needed for migraine. May repeat in 2 hours if needed 12/04/14   Jearld Fenton, NP  rosuvastatin (CRESTOR) 10 MG tablet TAKE 1 TABLET (10 MG TOTAL) BY MOUTH DAILY. MUST SCHEDULE ANNUAL PHYSICAL FOR FURTHER REFILLS 02/27/16   Jearld Fenton, NP  terbinafine (LAMISIL) 250 MG tablet Take 1 tablet (250 mg total) by mouth daily. 12/14/14   Jearld Fenton, NP  topiramate (TOPAMAX) 25 MG tablet Take 100 mg by mouth at bedtime as needed. 1 tablet hs,  may increase up to 6 tablets per day.    [provider]  traMADol (ULTRAM) 50 MG tablet Take 1 tablet (50 mg total) by mouth every 8 (eight) hours as needed. 12/04/14   Jearld Fenton, NP    Family History Family History  Problem Relation Age of Onset  . Arthritis Mother   . Colon cancer Mother 67  . Heart disease Father   . Parkinson's disease Father   . Hypertension Brother   . Cancer Maternal Grandmother   . Heart disease Paternal Grandmother   . Arthritis Paternal Grandmother     Social History Social History   Tobacco Use  . Smoking status: Never Smoker  . Smokeless tobacco: Never Used  Substance Use Topics  . Alcohol use: No  . Drug use: No      Allergies   Ambien [zolpidem tartrate], Codeine, Benadryl [diphenhydramine], and Penicillins   Review of Systems Review of Systems   Physical Exam Triage Vital Signs ED Triage Vitals  Enc Vitals Group     BP 10/07/20 1733 (!) 140/109     Pulse Rate 10/07/20 1733 85     Resp 10/07/20 1733 16     Temp 10/07/20 1733 98.9 F (37.2 C)     Temp Source 10/07/20 1733 Oral     SpO2 10/07/20 1733 98 %     Weight --      Height --      Head Circumference --      Peak Flow --      Pain Score 10/07/20 1732 7     Pain Loc --      Pain Edu? --      Excl. in [place]? --    No data found.  Updated Vital Signs BP (!) 140/109 (BP Location: Right Arm)   Pulse 85   Temp 98.9 F (37.2 C) (Oral)   Resp 16   SpO2 98%   Visual Acuity Right Eye Distance:   Left Eye Distance:   Bilateral Distance:    Right Eye Near:   Left Eye Near:    Bilateral Near:     Physical Exam Constitutional:      General: She is not in acute distress.    Appearance: She is well-developed.  Cardiovascular:     Rate and Rhythm: Normal rate and regular rhythm.     Heart sounds: Normal heart sounds.     Comments: Hand to left chest throughout discussion due to pain  Pulmonary:     Effort: Pulmonary effort is normal. No respiratory distress.     Comments: Patient appears somewhat short of breath when speaking, she denies this Chest:     Chest wall: No tenderness.  Skin:    General: Skin is warm and dry.  Neurological:     Mental Status: She is alert and oriented to person, place, and time.    No previous EKGs, BBB noted with rate of 84  UC Treatments / Results  Labs (all labs ordered are listed, but only abnormal results are displayed) Labs Reviewed - No data to display  EKG   Radiology No results found.  Procedures Procedures (including critical care time)  Medications Ordered in UC Medications - No data to display  Initial Impression / Assessment and Plan / UC Course  I have  reviewed the triage vital signs and the nursing notes.  Pertinent labs & imaging results that were available during my care of the patient were reviewed by me and  considered in my medical decision making (see chart for details).     Active chest pain, worse with activity, with abnormal ekg here today as well as hypertension. History of obesity and hyperlipidemia. Transported via ems to ER for acs rule out.  Final Clinical Impressions(s) / UC Diagnoses   Final diagnoses:  Chest pain, unspecified type  Abnormal EKG   Discharge Instructions   None    ED Prescriptions    None     PDMP not reviewed this encounter.   Zigmund Gottron, NP 10/07/20 1759

## 2020-10-07 NOTE — ED Provider Notes (Signed)
[facility] Provider Note   CSN: [id number] Arrival date & time: 10/07/20  1827     History Chief Complaint  Patient presents with  . Chest Pain    Mary Glass is a 64 y.o. female.  She has no prior cardiac history.  Complaining of sharp left-sided chest pain going through to her back that started this morning.  Worsened throughout the day.  Rates it as severe now.  Denies prior history of same.  Associated with some nausea.  No shortness of breath diaphoresis.  She said she has had a panic attack before but is unclear if this is what that felt like.  She went to urgent care where she received 4 baby aspirin.  The history is provided by the patient.  Chest Pain Pain location:  Substernal area Pain quality: stabbing   Pain radiates to:  Mid back Pain severity:  Severe Onset quality:  Gradual Timing:  Constant Progression:  Worsening Chronicity:  New Relieved by:  None tried Worsened by:  Exertion Ineffective treatments:  Aspirin Associated symptoms: anxiety and nausea   Associated symptoms: no abdominal pain, no cough, no diaphoresis, no fever, no headache, no heartburn, no shortness of breath and no vomiting     HPI: A 64 year old patient with a history of obesity presents for evaluation of chest pain. Initial onset of pain was more than 6 hours ago. The patient's chest pain is sharp and is worse with exertion. The patient complains of nausea. The patient's chest pain is middle- or left-sided, is not well-localized, is not described as heaviness/pressure/tightness and does not radiate to the arms/jaw/neck. The patient denies diaphoresis. The patient has a family history of coronary artery disease in a first-degree relative with onset less than age 17. The patient has no history of stroke, has no history of peripheral artery disease, has not smoked in the past 90 days, denies any history of treated diabetes, is not hypertensive and has no  history of hypercholesterolemia.   Past Medical History:  Diagnosis Date  . Allergy   . Anxiety   . Arthritis   . Depression   . GERD (gastroesophageal reflux disease)   . Hyperlipidemia   . Migraine     Patient Active Problem List   Diagnosis Date Noted  . HLD (hyperlipidemia) 05/18/2014  . Toenail fungus 02/15/2014  . Obesity (BMI 30-39.9) 01/16/2014  . Insomnia 01/16/2014  . Hot flashes 01/16/2014  . Migraine 12/27/2012  . Depression 12/27/2012  . GERD (gastroesophageal reflux disease) 12/27/2012    Past Surgical History:  Procedure Laterality Date  . ABDOMINAL HYSTERECTOMY  1995  . FOOT SURGERY       OB History   No obstetric history on file.     Family History  Problem Relation Age of Onset  . Arthritis Mother   . Colon cancer Mother 41  . Heart disease Father   . Parkinson's disease Father   . Hypertension Brother   . Cancer Maternal Grandmother   . Heart disease Paternal Grandmother   . Arthritis Paternal Grandmother     Social History   Tobacco Use  . Smoking status: Never Smoker  . Smokeless tobacco: Never Used  Substance Use Topics  . Alcohol use: No  . Drug use: No    Home Medications Prior to Admission medications   Medication Sig Start Date End Date Taking? Authorizing Provider  amitriptyline (ELAVIL) 75 MG tablet Take 1 tablet (75 mg total) by mouth at bedtime. 12/04/14  Jearld Fenton, NP  buPROPion (WELLBUTRIN XL) 150 MG 24 hr tablet Take 1 tablet (150 mg total) by mouth daily. PATIENT NEEDS OFFICE VISIT FOR ADDITIONAL REFILLS 12/04/14   Jearld Fenton, NP  cetirizine (ZYRTEC) 10 MG tablet Take 10 mg by mouth daily.    [provider]  Efinaconazole 10 % SOLN Apply 1 application topically daily. 12/04/14   Jearld Fenton, NP  Glucosamine-Chondroitin (GLUCOSAMINE CHONDR COMPLEX PO) Take 1 capsule by mouth daily.    [provider]  KRILL OIL PO Take 1 capsule by mouth 2 (two) times daily.    [provider]    methocarbamol (ROBAXIN) 500 MG tablet TAKE 1 TABLET (500 MG TOTAL) BY MOUTH EVERY 8 (EIGHT) HOURS AS NEEDED FOR MUSCLE SPASMS. 02/20/15   Jearld Fenton, NP  omeprazole (PRILOSEC) 20 MG capsule Take 1 capsule (20 mg total) by mouth 2 (two) times daily before a meal. MUST SCHEDULE ANNUAL EXAM 05/23/16   Jearld Fenton, NP  PARoxetine (PAXIL) 30 MG tablet TAKE 1 TABLET (30 MG TOTAL) BY MOUTH DAILY. MUST SCHEDULE ANNUAL PHYSICAL FOR FURTHER REFILLS 06/03/16   Jearld Fenton, NP  rizatriptan (MAXALT) 5 MG tablet Take 1 tablet (5 mg total) by mouth as needed for migraine. May repeat in 2 hours if needed 12/04/14   Jearld Fenton, NP  rosuvastatin (CRESTOR) 10 MG tablet TAKE 1 TABLET (10 MG TOTAL) BY MOUTH DAILY. MUST SCHEDULE ANNUAL PHYSICAL FOR FURTHER REFILLS 02/27/16   Jearld Fenton, NP  terbinafine (LAMISIL) 250 MG tablet Take 1 tablet (250 mg total) by mouth daily. 12/14/14   Jearld Fenton, NP  topiramate (TOPAMAX) 25 MG tablet Take 100 mg by mouth at bedtime as needed. 1 tablet hs, may increase up to 6 tablets per day.    [provider]  traMADol (ULTRAM) 50 MG tablet Take 1 tablet (50 mg total) by mouth every 8 (eight) hours as needed. 12/04/14   Jearld Fenton, NP    Allergies    Ambien [zolpidem tartrate], Codeine, Benadryl [diphenhydramine], and Penicillins  Review of Systems   Review of Systems  Constitutional: Negative for diaphoresis and fever.  HENT: Negative for sore throat.   Eyes: Negative for visual disturbance.  Respiratory: Negative for cough and shortness of breath.   Cardiovascular: Positive for chest pain.  Gastrointestinal: Positive for nausea. Negative for abdominal pain, heartburn and vomiting.  Genitourinary: Negative for dysuria.  Musculoskeletal: Negative for neck pain.  Skin: Negative for rash.  Neurological: Negative for headaches.    Physical Exam Updated Vital Signs BP 103/86 (BP Location: Right Arm)   Pulse 82   Temp 98.5 F (36.9 C) (Oral)    Resp 18   Ht 5' 5.5" (1.664 m)   Wt 102.5 kg   SpO2 100%   BMI 37.04 kg/m   Physical Exam Vitals and nursing note reviewed.  Constitutional:      General: She is not in acute distress.    Appearance: She is well-developed.  HENT:     Head: Normocephalic and atraumatic.  Eyes:     Conjunctiva/sclera: Conjunctivae normal.  Cardiovascular:     Rate and Rhythm: Normal rate and regular rhythm.     Heart sounds: No murmur heard.   Pulmonary:     Effort: Pulmonary effort is normal. No respiratory distress.     Breath sounds: Normal breath sounds.  Abdominal:     Palpations: Abdomen is soft.     Tenderness: There  is no abdominal tenderness.  Musculoskeletal:        General: Normal range of motion.     Cervical back: Neck supple.     Right lower leg: No tenderness. No edema.     Left lower leg: No tenderness. No edema.  Skin:    General: Skin is warm and dry.     Capillary Refill: Capillary refill takes less than 2 seconds.  Neurological:     General: No focal deficit present.     Mental Status: She is alert.     ED Results / Procedures / Treatments   Labs (all labs ordered are listed, but only abnormal results are displayed) Labs Reviewed  BASIC METABOLIC PANEL - Abnormal; Notable for the following components:      Result Value   CO2 21 (*)    Glucose, Bld 101 (*)    Creatinine, Ser 1.01 (*)    All other components within normal limits  CBC - Abnormal; Notable for the following components:   WBC 12.8 (*)    All other components within normal limits  HEPARIN LEVEL (UNFRACTIONATED) - Abnormal; Notable for the following components:   Heparin Unfractionated <0.10 (*)    All other components within normal limits  LIPID PANEL - Abnormal; Notable for the following components:   Triglycerides 202 (*)    All other components within normal limits  BASIC METABOLIC PANEL - Abnormal; Notable for the following components:   Glucose, Bld 103 (*)    Calcium 8.8 (*)    All other  components within normal limits  RESPIRATORY PANEL BY RT PCR (FLU A&B, COVID)  HIV ANTIBODY (ROUTINE TESTING W REFLEX)  D-DIMER, QUANTITATIVE (NOT AT [facility])  HEPARIN LEVEL (UNFRACTIONATED)  TROPONIN I (HIGH SENSITIVITY)  TROPONIN I (HIGH SENSITIVITY)    EKG EKG Interpretation  Date/Time:  Sunday October 07 2020 21:34:38 EST Ventricular Rate:  79 PR Interval:    QRS Duration: 158 QT Interval:  465 QTC Calculation: 534 R Axis:   118 Text Interpretation: Sinus rhythm Short PR interval Nonspecific intraventricular conduction delay ST depr, consider ischemia, inferior leads Confirmed by Aletta Edouard [phone]) on 10/07/2020 9:42:39 PM   Radiology DG Chest 2 View  Result Date: 10/07/2020 CLINICAL DATA:  Left-sided chest pain with cough. EXAM: CHEST - 2 VIEW COMPARISON:  02/26/2004 FINDINGS: The heart size and mediastinal contours are within normal limits. There are few nodular densities in the left mid lung zone that are stable across multiple prior studies dating back to 2005. Otherwise, the lungs are clear. The visualized skeletal structures are unremarkable. IMPRESSION: No active cardiopulmonary disease. Electronically Signed   By: Constance Holster M.D.   On: 10/07/2020 19:04    Procedures .Critical Care Performed by: Hayden Rasmussen, MD Authorized by: Hayden Rasmussen, MD   Critical care provider statement:    Critical care time (minutes):  45   Critical care time was exclusive of:  Separately billable procedures and treating other patients   Critical care was necessary to treat or prevent imminent or life-threatening deterioration of the following conditions:  Cardiac failure   Critical care was time spent personally by me on the following activities:  Discussions with consultants, evaluation of patient's response to treatment, examination of patient, ordering and performing treatments and interventions, ordering and review of laboratory studies, ordering and review of  radiographic studies, pulse oximetry, re-evaluation of patient's condition, obtaining history from patient or surrogate, review of old charts and development of treatment plan with patient  or surrogate   (including critical care time)  Medications Ordered in ED Medications  nitroGLYCERIN (NITROSTAT) SL tablet 0.4 mg (has no administration in time range)  heparin ADULT infusion 100 units/mL (25000 units/266mL sodium chloride 0.45%) (1,300 Units/hr Intravenous Rate/Dose Change 10/08/20 0545)  acetaminophen (TYLENOL) tablet 650 mg (has no administration in time range)  ondansetron (ZOFRAN) injection 4 mg (has no administration in time range)  aspirin EC tablet 81 mg (81 mg Oral Given 10/08/20 1106)  amitriptyline (ELAVIL) tablet 75 mg (75 mg Oral Given 10/08/20 0118)  rosuvastatin (CRESTOR) tablet 10 mg (10 mg Oral Given 10/08/20 0118)  hydrOXYzine (ATARAX/VISTARIL) tablet 50 mg (has no administration in time range)  buPROPion (WELLBUTRIN XL) 24 hr tablet 150 mg (150 mg Oral Given 10/08/20 1106)  metoprolol tartrate (LOPRESSOR) tablet 25 mg (25 mg Oral Not Given 10/08/20 1106)  perflutren lipid microspheres (DEFINITY) IV suspension (3 mLs Intravenous Given 10/08/20 1000)  morphine 4 MG/ML injection 4 mg (4 mg Intravenous Given 10/07/20 2207)  heparin bolus via infusion 4,000 Units (4,000 Units Intravenous Bolus from Bag 10/07/20 2323)  heparin bolus via infusion 4,000 Units (4,000 Units Intravenous Bolus from Bag 10/08/20 0548)    ED Course  I have reviewed the triage vital signs and the nursing notes.  Pertinent labs & imaging results that were available during my care of the patient were reviewed by me and considered in my medical decision making (see chart for details).  Clinical Course as of Oct 08 1114  Sun Oct 07, 2020  2143 Reviewed EKGs with Dr. Einar Gip [facility]I attending on call.  He said this is a left bundle and he does not feel like there is significantly changed.   [MB]  2236  Discussed with cardiology on-call Dr. Terri Skains.  He agrees the patient is having some EKG changes and she should be heparinized admitted to the hospital for further work-up.  He will consult on the patient.   [MB]  2347 Discussed with Dr. Posey Pronto [facility] hospitalist will evaluate the patient for admission.   [MB]    Clinical Course User Index [MB] Hayden Rasmussen, MD   MDM Rules/Calculators/A&P HEAR Score: 4                       This patient complains of left-sided chest pain severe in nature; this involves an extensive number of treatment Options and is a complaint that carries with it a high risk of complications and Morbidity. The differential includes ACS, GERD, PE, pneumothorax, vascular  I ordered, reviewed and interpreted labs, which included CBC with mildly elevated white count, normal hemoglobin, chemistries normal, troponins flat, Covid testing negative I ordered medication IV pain medicine, IV heparin I ordered imaging studies which included chest x-ray and I independently    visualized and interpreted imaging which showed no acute disease Additional history obtained from patient's husband Previous records obtained and reviewed in epic, no prior cardiac work-ups I consulted Dr. Terri Skains cardiology and Dr. Posey Pronto [facility] hospitalist and discussed lab and imaging findings.  Also reviewed EKGs with [facility] attending Dr. Einar Gip  Critical Interventions: Management of unstable angina with initiation of IV heparin  After the interventions stated above, I reevaluated the patient and found still to be having pain although improved from prior.  She is agreeable to admission for further cardiac work-up.   Final Clinical Impression(s) / ED Diagnoses Final diagnoses:  Unstable angina ([place])    Rx / DC Orders ED Discharge Orders  None       Hayden Rasmussen, MD 10/08/20 1121

## 2020-10-07 NOTE — ED Notes (Signed)
Patient is being discharged from the Urgent Care and sent to the Emergency Department via [facility] . Per Augusto Gamble, NP, patient is in need of higher level of care due to Chest Pain, abnormal EKG. Patient is aware and verbalizes understanding of plan of care.  Vitals:   10/07/20 1733  BP: (!) 140/109  Pulse: 85  Resp: 16  Temp: 98.9 F (37.2 C)  SpO2: 98%

## 2020-10-07 NOTE — ED Notes (Signed)
Notified [facility] of need to transport

## 2020-10-08 ENCOUNTER — Observation Stay (HOSPITAL_COMMUNITY): Payer: BC Managed Care – PPO

## 2020-10-08 ENCOUNTER — Encounter (HOSPITAL_COMMUNITY): Payer: Self-pay | Admitting: Internal Medicine

## 2020-10-08 DIAGNOSIS — R079 Chest pain, unspecified: Secondary | ICD-10-CM

## 2020-10-08 DIAGNOSIS — I340 Nonrheumatic mitral (valve) insufficiency: Secondary | ICD-10-CM | POA: Diagnosis not present

## 2020-10-08 DIAGNOSIS — Z6836 Body mass index (BMI) 36.0-36.9, adult: Secondary | ICD-10-CM | POA: Diagnosis not present

## 2020-10-08 DIAGNOSIS — Z23 Encounter for immunization: Secondary | ICD-10-CM | POA: Diagnosis not present

## 2020-10-08 DIAGNOSIS — F32A Depression, unspecified: Secondary | ICD-10-CM | POA: Diagnosis present

## 2020-10-08 DIAGNOSIS — F410 Panic disorder [episodic paroxysmal anxiety] without agoraphobia: Secondary | ICD-10-CM | POA: Diagnosis present

## 2020-10-08 DIAGNOSIS — Z66 Do not resuscitate: Secondary | ICD-10-CM | POA: Diagnosis present

## 2020-10-08 DIAGNOSIS — M940 Chondrocostal junction syndrome [Tietze]: Secondary | ICD-10-CM | POA: Diagnosis present

## 2020-10-08 DIAGNOSIS — E669 Obesity, unspecified: Secondary | ICD-10-CM | POA: Diagnosis present

## 2020-10-08 DIAGNOSIS — E785 Hyperlipidemia, unspecified: Secondary | ICD-10-CM | POA: Diagnosis present

## 2020-10-08 DIAGNOSIS — Z8249 Family history of ischemic heart disease and other diseases of the circulatory system: Secondary | ICD-10-CM | POA: Diagnosis not present

## 2020-10-08 DIAGNOSIS — G43909 Migraine, unspecified, not intractable, without status migrainosus: Secondary | ICD-10-CM | POA: Diagnosis present

## 2020-10-08 DIAGNOSIS — Z79899 Other long term (current) drug therapy: Secondary | ICD-10-CM | POA: Diagnosis not present

## 2020-10-08 DIAGNOSIS — Z888 Allergy status to other drugs, medicaments and biological substances status: Secondary | ICD-10-CM | POA: Diagnosis not present

## 2020-10-08 DIAGNOSIS — Z820 Family history of epilepsy and other diseases of the nervous system: Secondary | ICD-10-CM | POA: Diagnosis not present

## 2020-10-08 DIAGNOSIS — K219 Gastro-esophageal reflux disease without esophagitis: Secondary | ICD-10-CM | POA: Diagnosis present

## 2020-10-08 DIAGNOSIS — Z885 Allergy status to narcotic agent status: Secondary | ICD-10-CM | POA: Diagnosis not present

## 2020-10-08 DIAGNOSIS — Z91011 Allergy to milk products: Secondary | ICD-10-CM | POA: Diagnosis not present

## 2020-10-08 DIAGNOSIS — I200 Unstable angina: Secondary | ICD-10-CM | POA: Diagnosis present

## 2020-10-08 DIAGNOSIS — I447 Left bundle-branch block, unspecified: Secondary | ICD-10-CM | POA: Diagnosis present

## 2020-10-08 DIAGNOSIS — Z20822 Contact with and (suspected) exposure to covid-19: Secondary | ICD-10-CM | POA: Diagnosis present

## 2020-10-08 DIAGNOSIS — Z880 Allergy status to penicillin: Secondary | ICD-10-CM | POA: Diagnosis not present

## 2020-10-08 DIAGNOSIS — Z8261 Family history of arthritis: Secondary | ICD-10-CM | POA: Diagnosis not present

## 2020-10-08 DIAGNOSIS — Z800 Family history of malignant neoplasm of digestive organs: Secondary | ICD-10-CM | POA: Diagnosis not present

## 2020-10-08 LAB — ECHOCARDIOGRAM COMPLETE
Height: 65.5 in
S' Lateral: 4.05 cm
Weight: 3616 oz

## 2020-10-08 LAB — LIPID PANEL
Cholesterol: 168 mg/dL (ref 0–200)
HDL: 45 mg/dL (ref >40)
LDL Cholesterol: 83 mg/dL (ref 0–99)
Total CHOL/HDL Ratio: 3.7 RATIO
Triglycerides: 202 mg/dL — ABNORMAL HIGH (ref <150)
VLDL: 40 mg/dL (ref 0–40)

## 2020-10-08 LAB — D-DIMER, QUANTITATIVE: D-Dimer, Quant: <0.27 ug/mL-FEU (ref 0.00–0.50)

## 2020-10-08 LAB — BASIC METABOLIC PANEL
Anion gap: 10 (ref 5–15)
BUN: 13 mg/dL (ref 8–23)
CO2: 23 mmol/L (ref 22–32)
Calcium: 8.8 mg/dL — ABNORMAL LOW (ref 8.9–10.3)
Chloride: 102 mmol/L (ref 98–111)
Creatinine, Ser: 0.93 mg/dL (ref 0.44–1.00)
GFR, Estimated: >60 mL/min (ref >60)
Glucose, Bld: 103 mg/dL — ABNORMAL HIGH (ref 70–99)
Potassium: 3.7 mmol/L (ref 3.5–5.1)
Sodium: 135 mmol/L (ref 135–145)

## 2020-10-08 LAB — RESPIRATORY PANEL BY RT PCR (FLU A&B, COVID)
Influenza A by PCR: NEGATIVE
Influenza B by PCR: NEGATIVE
SARS Coronavirus 2 by RT PCR: NEGATIVE

## 2020-10-08 LAB — HEPARIN LEVEL (UNFRACTIONATED)
Heparin Unfractionated: <0.1 IU/mL — ABNORMAL LOW (ref 0.30–0.70)
Heparin Unfractionated: 0.12 IU/mL — ABNORMAL LOW (ref 0.30–0.70)
Heparin Unfractionated: 0.28 IU/mL — ABNORMAL LOW (ref 0.30–0.70)

## 2020-10-08 LAB — HIV ANTIBODY (ROUTINE TESTING W REFLEX): HIV Screen 4th Generation wRfx: NONREACTIVE

## 2020-10-08 MED ORDER — AMITRIPTYLINE HCL 25 MG PO TABS
75.0000 mg | ORAL_TABLET | Freq: Every day | ORAL | Status: DC
  Administered 2020-10-08 (×2): 75 mg via ORAL
  Filled 2020-10-08 (×3): qty 3
  Filled 2020-10-08: qty 1

## 2020-10-08 MED ORDER — HYDROXYZINE HCL 25 MG PO TABS
50.0000 mg | ORAL_TABLET | Freq: Every evening | ORAL | Status: DC | PRN
  Administered 2020-10-08: 50 mg via ORAL
  Filled 2020-10-08: qty 2

## 2020-10-08 MED ORDER — METOPROLOL TARTRATE 25 MG PO TABS
25.0000 mg | ORAL_TABLET | Freq: Two times a day (BID) | ORAL | Status: DC
  Administered 2020-10-08: 25 mg via ORAL
  Filled 2020-10-08 (×3): qty 1

## 2020-10-08 MED ORDER — ROSUVASTATIN CALCIUM 5 MG PO TABS
10.0000 mg | ORAL_TABLET | Freq: Every day | ORAL | Status: DC
  Administered 2020-10-08 (×2): 10 mg via ORAL
  Filled 2020-10-08 (×2): qty 2

## 2020-10-08 MED ORDER — BUPROPION HCL ER (XL) 150 MG PO TB24
150.0000 mg | ORAL_TABLET | Freq: Every day | ORAL | Status: DC
  Administered 2020-10-08 – 2020-10-09 (×2): 150 mg via ORAL
  Filled 2020-10-08 (×2): qty 1

## 2020-10-08 MED ORDER — HEPARIN BOLUS VIA INFUSION
4000.0000 [IU] | Freq: Once | INTRAVENOUS | Status: AC
  Administered 2020-10-08: 4000 [IU] via INTRAVENOUS
  Filled 2020-10-08: qty 4000

## 2020-10-08 MED ORDER — ASPIRIN EC 81 MG PO TBEC
81.0000 mg | DELAYED_RELEASE_TABLET | Freq: Every day | ORAL | Status: DC
  Administered 2020-10-08: 81 mg via ORAL
  Filled 2020-10-08 (×2): qty 1

## 2020-10-08 MED ORDER — ACETAMINOPHEN 325 MG PO TABS: 650.0000 mg | ORAL_TABLET | ORAL | Status: DC | PRN

## 2020-10-08 MED ORDER — PERFLUTREN LIPID MICROSPHERE
1.0000 mL | INTRAVENOUS | Status: AC | PRN
  Administered 2020-10-08: 3 mL via INTRAVENOUS
  Filled 2020-10-08: qty 10

## 2020-10-08 MED ORDER — ONDANSETRON HCL 4 MG/2ML IJ SOLN: 4.0000 mg | Freq: Four times a day (QID) | INTRAMUSCULAR | Status: DC | PRN

## 2020-10-08 MED ORDER — INFLUENZA VAC SPLIT QUAD 0.5 ML IM SUSY
0.5000 mL | PREFILLED_SYRINGE | INTRAMUSCULAR | Status: AC
  Administered 2020-10-09: 0.5 mL via INTRAMUSCULAR
  Filled 2020-10-08: qty 0.5

## 2020-10-08 NOTE — ED Notes (Signed)
OTF to vascular

## 2020-10-08 NOTE — Progress Notes (Signed)
PROGRESS NOTE    Mary Glass  WFU:[id number] DOB: 12-12-1955 DOA: 10/07/2020 PCP: [facility]    Brief Narrative:  This 64 years old female with medical history significant for hyperlipidemia, GERD, depression, anxiety, migraine presents to the ED with complaints of Chest pain.  Patient reports having chest pain while at rest.  She describes pain as constant, stabbing sensation radiating towards the back.  She has normal troponins but EKG shows sinus rhythm with LBBB and nonspecific T wave changes in inferior leads.  Patient is started on heparin gtt.  Cardiology consulted thinks patient might be having unstable angina pectoris recommended left heart catheterization.  Patient agreed. PLAN: Left heart catheterization tomorrow.   Assessment & Plan:   Principal Problem:   Chest pain Active Problems:   Migraine   HLD (hyperlipidemia)   Chest pain could be unstable Angina: Patient with atypical chest pain.  Symptoms are reproducible on exam.   EKG shows left bundle branch block.  Troponin is negative x2.    EDP discussed with on-call cardiology who recommended heparin anticoagulation  Continue heparin drip -Started on aspirin 81 mg daily -Continue rosuvastatin -Echocardiogram completed report pending. Cardiology consult appreciated thinks patient might be having unstable angina  recommended left heart catheterization, metoprolol 25 mg twice daily. -Keep n.p.o. midnight  Hyperlipidemia: Continue rosuvastatin.  Depression/anxiety: Continue bupropion.  Migraine disorder: Continue Elavil.   DVT prophylaxis: Heparin gtt. Code Status: Full code  Family Communication: Daughter was at bedside Disposition Plan:  Status is: Inpatient  Remains inpatient appropriate because:Inpatient level of care appropriate due to severity of illness   Dispo: The patient is from: Home              Anticipated d/c is to: Home              Anticipated d/c  date is: 2 days              Patient currently is not medically stable to d/c.   Consultants:   Cardiology  Procedures: Scheduled for cath tomorrow Antimicrobials:  Anti-infectives (From admission, onward)   None      Subjective: Patient was seen and examined at bedside.  Overnight events noted.  Patient continues to remain complain of chest pain.  Continued on heparin,  Patient is scheduled to have left heart cath tomorrow.  Objective: Vitals:   10/08/20 1400 10/08/20 1430 10/08/20 1500 10/08/20 1641  BP: 126/66 130/81 118/68 (!) 147/86  Pulse: 86 79 77 80  Resp: 18 (!) 23 19 20   Temp:    98.4 F (36.9 C)  TempSrc:    Oral  SpO2: 95% 94% 91% 94%  Weight:      Height:        Intake/Output Summary (Last 24 hours) at 10/08/2020 1659 Last data filed at 10/08/2020 0844 Gross per 24 hour  Intake --  Output 1 ml  Net -1 ml   Filed Weights   10/07/20 1830  Weight: 102.5 kg    Examination:  General exam: Appears calm and comfortable  Respiratory system: Clear to auscultation. Respiratory effort normal. Cardiovascular system: S1 & S2 heard, RRR. No JVD, murmurs, rubs, gallops or clicks. No pedal edema. Gastrointestinal system: Abdomen is nondistended, soft and nontender. No organomegaly or masses felt. Normal bowel sounds heard. Central nervous system: Alert and oriented. No focal neurological deficits. Extremities: No edema, no cyanosis, no clubbing. Skin: No rashes, lesions or ulcers Psychiatry: Judgement and insight appear normal. Mood & affect  appropriate.     Data Reviewed: I have personally reviewed following labs and imaging studies  CBC: Recent Labs  Lab 10/07/20 1841  WBC 12.8*  HGB 13.1  HCT 42.2  MCV 83.9  PLT 093   Basic Metabolic Panel: Recent Labs  Lab 10/07/20 1841 10/08/20 0357  NA 138 135  K 4.0 3.7  CL 104 102  CO2 21* 23  GLUCOSE 101* 103*  BUN 14 13  CREATININE 1.01* 0.93  CALCIUM 9.2 8.8*   GFR: Estimated Creatinine  Clearance: 74.2 mL/min (by C-G formula based on SCr of 0.93 mg/dL). Liver Function Tests: No results for input(s): AST, ALT, ALKPHOS, BILITOT, PROT, ALBUMIN in the last 168 hours. No results for input(s): LIPASE, AMYLASE in the last 168 hours. No results for input(s): AMMONIA in the last 168 hours. Coagulation Profile: No results for input(s): INR, PROTIME in the last 168 hours. Cardiac Enzymes: No results for input(s): CKTOTAL, CKMB, CKMBINDEX, TROPONINI in the last 168 hours. BNP (last 3 results) No results for input(s): PROBNP in the last 8760 hours. HbA1C: No results for input(s): HGBA1C in the last 72 hours. CBG: No results for input(s): GLUCAP in the last 168 hours. Lipid Profile: Recent Labs    10/08/20 0357  CHOL 168  HDL 45  LDLCALC 83  TRIG 202*  CHOLHDL 3.7   Thyroid Function Tests: No results for input(s): TSH, T4TOTAL, FREET4, T3FREE, THYROIDAB in the last 72 hours. Anemia Panel: No results for input(s): VITAMINB12, FOLATE, FERRITIN, TIBC, IRON, RETICCTPCT in the last 72 hours. Sepsis Labs: No results for input(s): PROCALCITON, LATICACIDVEN in the last 168 hours.  Recent Results (from the past 240 hour(s))  Respiratory Panel by RT PCR (Flu A&B, Covid) - Nasopharyngeal Swab     Status: None   Collection Time: 10/07/20 10:46 PM   Specimen: Nasopharyngeal Swab  Result Value Ref Range Status   SARS Coronavirus 2 by RT PCR NEGATIVE NEGATIVE Final    Comment: (NOTE) SARS-CoV-2 target nucleic acids are NOT DETECTED.  The SARS-CoV-2 RNA is generally detectable in upper respiratoy specimens during the acute phase of infection. The lowest concentration of SARS-CoV-2 viral copies this assay can detect is 131 copies/mL. A negative result does not preclude SARS-Cov-2 infection and should not be used as the sole basis for treatment or other patient management decisions. A negative result may occur with  improper specimen collection/handling, submission of specimen  other than nasopharyngeal swab, presence of viral mutation(s) within the areas targeted by this assay, and inadequate number of viral copies (<131 copies/mL). A negative result must be combined with clinical observations, patient history, and epidemiological information. The expected result is Negative.  Fact Sheet for Patients:  PinkCheek.be  Fact Sheet for Healthcare Providers:  GravelBags.it  This test is no t yet approved or cleared by the [place] FDA and  has been authorized for detection and/or diagnosis of SARS-CoV-2 by FDA under an Emergency Use Authorization (EUA). This EUA will remain  in effect (meaning this test can be used) for the duration of the COVID-19 declaration under Section 564(b)(1) of the Act, 21 U.S.C. section 360bbb-3(b)(1), unless the authorization is terminated or revoked sooner.     Influenza A by PCR NEGATIVE NEGATIVE Final   Influenza B by PCR NEGATIVE NEGATIVE Final    Comment: (NOTE) The Xpert Xpress SARS-CoV-2/FLU/RSV assay is intended as an aid in  the diagnosis of influenza from Nasopharyngeal swab specimens and  should not be used as a sole basis for treatment.  Nasal washings and  aspirates are unacceptable for Xpert Xpress SARS-CoV-2/FLU/RSV  testing.  Fact Sheet for Patients: PinkCheek.be  Fact Sheet for Healthcare Providers: GravelBags.it  This test is not yet approved or cleared by the [place] FDA and  has been authorized for detection and/or diagnosis of SARS-CoV-2 by  FDA under an Emergency Use Authorization (EUA). This EUA will remain  in effect (meaning this test can be used) for the duration of the  Covid-19 declaration under Section 564(b)(1) of the Act, 21  U.S.C. section 360bbb-3(b)(1), unless the authorization is  terminated or revoked. Performed at [facility], [place] [address],  [place], [place] [address]      Radiology Studies: DG Chest 2 View  Result Date: 10/07/2020 CLINICAL DATA:  Left-sided chest pain with cough. EXAM: CHEST - 2 VIEW COMPARISON:  02/26/2004 FINDINGS: The heart size and mediastinal contours are within normal limits. There are few nodular densities in the left mid lung zone that are stable across multiple prior studies dating back to 2005. Otherwise, the lungs are clear. The visualized skeletal structures are unremarkable. IMPRESSION: No active cardiopulmonary disease. Electronically Signed   By: Constance Holster M.D.   On: 10/07/2020 19:04   ECHOCARDIOGRAM COMPLETE  Result Date: 10/08/2020    ECHOCARDIOGRAM REPORT   Patient Name:   Mary Glass Date of Exam: 10/08/2020 Medical Rec #:  [id number]        Height:       65.5 in Accession #:    [id number]       Weight:       226.0 lb Date of Birth:  [birth year]         BSA:          2.095 m Patient Age:    21 years         BP:           137/85 mmHg Patient Gender: F                HR:           78 bpm. Exam Location:  Inpatient Procedure: 2D Echo Indications:    Chest Pain R07.9  History:        Patient has no prior history of Echocardiogram examinations.                 Risk Factors:Dyslipidemia.  Sonographer:    Mikki Santee RDCS (AE) Referring Phys: [id number] [place]  1. LAD infarct pattern. Left ventricular ejection fraction, by estimation, is 35 to 40%. The left ventricle has moderately decreased function. The left ventricle demonstrates regional wall motion abnormalities (see scoring diagram/findings for description). There is mild left ventricular hypertrophy. Left ventricular diastolic parameters are consistent with Grade I diastolic dysfunction (impaired relaxation).  2. Right ventricular systolic function is normal. The right ventricular size is normal.  3. The mitral valve is normal in structure. Mild mitral valve regurgitation. No evidence of mitral stenosis.  4. The aortic valve  is normal in structure. Aortic valve regurgitation is not visualized. No aortic stenosis is present.  5. The inferior vena cava is normal in size with greater than 50% respiratory variability, suggesting right atrial pressure of 3 mmHg. FINDINGS  Left Ventricle: LAD infarct pattern. Left ventricular ejection fraction, by estimation, is 35 to 40%. The left ventricle has moderately decreased function. The left ventricle demonstrates regional wall motion abnormalities. The left ventricular internal  cavity size was normal in size. There is mild  left ventricular hypertrophy. Left ventricular diastolic parameters are consistent with Grade I diastolic dysfunction (impaired relaxation).  LV Wall Scoring: The mid and distal anterior septum and mid inferoseptal segment are akinetic. Right Ventricle: The right ventricular size is normal. No increase in right ventricular wall thickness. Right ventricular systolic function is normal. Left Atrium: Left atrial size was normal in size. Right Atrium: Right atrial size was normal in size. Pericardium: There is no evidence of pericardial effusion. Mitral Valve: The mitral valve is normal in structure. Mild mitral valve regurgitation. No evidence of mitral valve stenosis. Tricuspid Valve: The tricuspid valve is normal in structure. Tricuspid valve regurgitation is not demonstrated. No evidence of tricuspid stenosis. Aortic Valve: The aortic valve is normal in structure. Aortic valve regurgitation is not visualized. No aortic stenosis is present. Pulmonic Valve: The pulmonic valve was normal in structure. Pulmonic valve regurgitation is not visualized. No evidence of pulmonic stenosis. Aorta: The aortic root is normal in size and structure. Venous: The inferior vena cava is normal in size with greater than 50% respiratory variability, suggesting right atrial pressure of 3 mmHg. IAS/Shunts: No atrial level shunt detected by color flow Doppler.  LEFT VENTRICLE PLAX 2D LVIDd:         5.25  cm LVIDs:         4.05 cm LV PW:         1.15 cm LV IVS:        1.20 cm LVOT diam:     2.20 cm LV SV:         73 LV SV Index:   35 LVOT Area:     3.80 cm  RIGHT VENTRICLE RV S prime:     9.46 cm/s TAPSE (M-mode): 0.9 cm LEFT ATRIUM             Index       RIGHT ATRIUM           Index LA diam:        3.45 cm 1.65 cm/m  RA Area:     11.50 cm LA Vol (A2C):   47.4 ml 22.62 ml/m RA Volume:   24.20 ml  11.55 ml/m LA Vol (A4C):   36.5 ml 17.42 ml/m LA Biplane Vol: 45.5 ml 21.71 ml/m  AORTIC VALVE LVOT Vmax:   92.50 cm/s LVOT Vmean:  62.267 cm/s LVOT VTI:    0.191 m  AORTA Ao Root diam: 3.10 cm  SHUNTS Systemic VTI:  0.19 m Systemic Diam: 2.20 cm Candee Furbish MD Electronically signed by Candee Furbish MD Signature Date/Time: 10/08/2020/10:43:11 AM    Final    Scheduled Meds: . amitriptyline  75 mg Oral QHS  . aspirin EC  81 mg Oral Daily  . buPROPion  150 mg Oral Daily  . metoprolol tartrate  25 mg Oral BID  . rosuvastatin  10 mg Oral QHS   Continuous Infusions: . heparin 1,500 Units/hr (10/08/20 1541)     LOS: 0 days    Time spent: 25 mins    Sindi Beckworth, MD [facility]   If 7PM-7AM, please contact night-coverage

## 2020-10-08 NOTE — Progress Notes (Signed)
[place] for heparin Indication: chest pain/ACS  Allergies  Allergen Reactions  . Hydrocodone-Acetaminophen     Other reaction(s): Other hyperactivity  . Sumatriptan     Other reaction(s): Other Head pain   . Ambien [Zolpidem Tartrate] Other (See Comments)    headaches  . Cheese Other (See Comments)    Causes migraine  . Codeine Other (See Comments)    hyperactivity  . Benadryl [Diphenhydramine] Rash    Topical only, makes rash worse  . Penicillins Nausea And Vomiting and Rash    Patient Measurements: Height: 5' 5.5" (166.4 cm) Weight: 102.5 kg (226 lb) IBW/kg (Calculated) : 58.15 Heparin Dosing Weight: 81.6 kg  Vital Signs: Temp: 98.3 F (36.8 C) (11/15 1016) Temp Source: Oral (11/15 0642) BP: 130/81 (11/15 1430) Pulse Rate: 79 (11/15 1430)  Labs: Recent Labs    10/07/20 1841 10/07/20 2207 10/08/20 0357 10/08/20 1413  HGB 13.1  --   --   --   HCT 42.2  --   --   --   PLT 370  --   --   --   HEPARINUNFRC  --   --  <0.10* 0.12*  CREATININE 1.01*  --  0.93  --   TROPONINIHS 6 6  --   --     Estimated Creatinine Clearance: 74.2 mL/min (by C-G formula based on SCr of 0.93 mg/dL).   Medical History: Past Medical History:  Diagnosis Date  . Allergy   . Anxiety   . Arthritis   . Depression   . GERD (gastroesophageal reflux disease)   . Hyperlipidemia   . Migraine     Medications:  See medication history  Assessment: 64 yo lady to start heparin for CP.  She was not on anticoagulation PTA.  Hg 13.1, PTLC 370  Heparin level remains subtherapeutic s/p rate increase to 1300 units/hr, no issues noted.  Cards plan for cath likely tomorrow  Goal of Therapy:  Heparin level 0.3-0.7 units/ml Monitor platelets by anticoagulation protocol: Yes   Plan:  Re-bolus heparin, increase heparin gtt to 1500 units/hr F/u 6 hour heparin level  Bertis Ruddy, PharmD Clinical Pharmacist ED Pharmacist Phone #  [phone] 10/08/2020 3:04 PM

## 2020-10-08 NOTE — Consult Note (Addendum)
CARDIOLOGY CONSULT NOTE  Patient ID: Mary Glass MRN: [id number] DOB/AGE: [birth year] 64 y.o.  Admit date: 10/07/2020 Attending physician: Lenore Cordia, MD Primary Physician:  [facility] Cardiologist: None Inpatient Cardiologist: Mechele Claude, [facility]  Chief complaint: Chest pain  Reason of Consultation: Anginal symptoms with EKG changes   HPI:  Mary Glass is a 65 y.o. Caucasian female who presents with a chief complaint of " chest pain." Her past medical history and cardiovascular risk factors include: Hyperlipidemia, obesity.  She denies history of hypertension, diabetes mellitus, MI, CVA, TIA, blood clots, or cancer. Denies history of tobacco, alcohol, or illicit drug use.   Patient presented to [facility] emergency department 10/07/2020 with chief complaint of chest pain.  She reports yesterday around breakfast she began feeling substernal chest discomfort which she describes as stabbing with 8/10 severity.  Pain radiates to her back.  Patient states this chest pain is worse with exertion and improves with rest denies associated shortness of breath, dizziness, syncope, near syncope.  She does report some mild nausea yesterday afternoon which has since improved.    Upon presentation in the emergency department, patient's EKG revealed left bundle branch block and ST changes in the inferior leads concerning for potential acute coronary syndrome.  Troponins in the emergency department were negative x2.  Cardiology was consulted due to patient's symptoms concerning for coronary artery disease and EKG changes.  Patient was started on IV heparin and morphine, as well as aspirin.  Patient seen and examined at 07:45 this morning, there is no family at bedside.  She reports since admission to the emergency department and treatment chest pain has improved to 3/10 severity.  ALLERGIES: Allergies  Allergen Reactions  . Ambien [Zolpidem Tartrate]  Other (See Comments)    headaches  . Codeine   . Benadryl [Diphenhydramine] Rash    Topical only, makes rash worse  . Penicillins Nausea And Vomiting and Rash    PAST MEDICAL HISTORY: Past Medical History:  Diagnosis Date  . Allergy   . Anxiety   . Arthritis   . Depression   . GERD (gastroesophageal reflux disease)   . Hyperlipidemia   . Migraine     PAST SURGICAL HISTORY: Past Surgical History:  Procedure Laterality Date  . ABDOMINAL HYSTERECTOMY  1995  . FOOT SURGERY      FAMILY HISTORY: The patient family history includes Arthritis in her mother and paternal grandmother; Cancer in her maternal grandmother; Colon cancer (age of onset: 22) in her mother; Heart disease in her father and paternal grandmother; Hypertension in her brother; Parkinson's disease in her father.   SOCIAL HISTORY:  The patient  reports that she has never smoked. She has never used smokeless tobacco. She reports that she does not drink alcohol and does not use drugs.  MEDICATIONS: Current Outpatient Medications  Medication Instructions  . amitriptyline (ELAVIL) 75 mg, Oral, Daily at bedtime  . buPROPion (WELLBUTRIN XL) 150 mg, Oral, Daily, PATIENT NEEDS OFFICE VISIT FOR ADDITIONAL REFILLS  . cetirizine (ZYRTEC) 10 mg, Daily  . Efinaconazole 10 % SOLN 1 application, Apply externally, Daily  . Glucosamine-Chondroitin (GLUCOSAMINE CHONDR COMPLEX PO) 1 capsule, Daily  . KRILL OIL PO 1 capsule, 2 times daily  . methocarbamol (ROBAXIN) 500 MG tablet TAKE 1 TABLET (500 MG TOTAL) BY MOUTH EVERY 8 (EIGHT) HOURS AS NEEDED FOR MUSCLE SPASMS.  Marland Kitchen omeprazole (PRILOSEC) 20 mg, Oral, 2 times daily before meals, MUST SCHEDULE ANNUAL EXAM  . PARoxetine (PAXIL) 30  MG tablet TAKE 1 TABLET (30 MG TOTAL) BY MOUTH DAILY. MUST SCHEDULE ANNUAL PHYSICAL FOR FURTHER REFILLS  . rizatriptan (MAXALT) 5 mg, Oral, As needed, May repeat in 2 hours if needed  . rosuvastatin (CRESTOR) 10 MG tablet TAKE 1 TABLET (10 MG TOTAL) BY  MOUTH DAILY. MUST SCHEDULE ANNUAL PHYSICAL FOR FURTHER REFILLS  . terbinafine (LAMISIL) 250 mg, Oral, Daily  . topiramate (TOPAMAX) 100 mg, At bedtime PRN  . traMADol (ULTRAM) 50 mg, Oral, Every 8 hours PRN   Review of Systems  Constitutional: Negative for malaise/fatigue and weight gain.  HENT: Negative.   Eyes: Negative.   Cardiovascular: Positive for chest pain. Negative for claudication, dyspnea on exertion, leg swelling, near-syncope, orthopnea, palpitations, paroxysmal nocturnal dyspnea and syncope.  Respiratory: Negative.  Negative for shortness of breath.   Endocrine: Negative.   Hematologic/Lymphatic: Does not bruise/bleed easily.  Skin: Negative.   Musculoskeletal: Positive for back pain.  Gastrointestinal: Positive for nausea. Negative for constipation, diarrhea, melena and vomiting.  Genitourinary: Negative.   Neurological: Negative for dizziness, light-headedness and weakness.  Psychiatric/Behavioral: Negative.   Allergic/Immunologic: Negative.   All other systems reviewed and are negative.   PHYSICAL EXAM: Vitals with BMI 10/08/2020 10/08/2020 10/08/2020  Height - - -  Weight - - -  BMI - - -  Systolic 081 448 185  Diastolic 71 62 57  Pulse 85 86 88    No intake or output data in the 24 hours ending 10/08/20 0814  Net IO Since Admission: No IO data has been entered for this period [10/08/20 0814]  CONSTITUTIONAL: Well-developed and well-nourished. No acute distress. Obese.   SKIN: Skin is warm and dry. No rash noted. No cyanosis. No pallor. No jaundice HEAD: Normocephalic and atraumatic.  EYES: No scleral icterus MOUTH/THROAT: Moist oral membranes.  NECK: No JVD present. No thyromegaly noted. No carotid bruits  LYMPHATIC: No visible cervical adenopathy.  CHEST Normal respiratory effort. No intercostal retractions  LUNGS: Clear to ascultation bilaterally. No stridor. No wheezes. No rales.  CARDIOVASCULAR: Regular rate and rhythm. Positive S1, S2. No murmurs,  rubs, or gallops appreciated.  ABDOMINAL: No apparent ascites.  EXTREMITIES: No peripheral edema. Radial pulses 2+ bilaterally. DP pulses 2+ bilaterally.  HEMATOLOGIC: No significant bruising NEUROLOGIC: Oriented to person, place, and time. Nonfocal. Normal muscle tone.  PSYCHIATRIC: Normal mood and affect. Normal behavior. Cooperative  RADIOLOGY: DG Chest 2 View  Result Date: 10/07/2020 CLINICAL DATA:  Left-sided chest pain with cough. EXAM: CHEST - 2 VIEW COMPARISON:  02/26/2004 FINDINGS: The heart size and mediastinal contours are within normal limits. There are few nodular densities in the left mid lung zone that are stable across multiple prior studies dating back to 2005. Otherwise, the lungs are clear. The visualized skeletal structures are unremarkable. IMPRESSION: No active cardiopulmonary disease. Electronically Signed   By: Constance Holster M.D.   On: 10/07/2020 19:04    LABORATORY DATA: Lab Results  Component Value Date   WBC 12.8 (H) 10/07/2020   HGB 13.1 10/07/2020   HCT 42.2 10/07/2020   MCV 83.9 10/07/2020   PLT 370 10/07/2020    Recent Labs  Lab 10/08/20 0357  NA 135  K 3.7  CL 102  CO2 23  BUN 13  CREATININE 0.93  CALCIUM 8.8*  GLUCOSE 103*    Lipid Panel     Component Value Date/Time   CHOL 168 10/08/2020 0357   TRIG 202 (H) 10/08/2020 0357   HDL 45 10/08/2020 0357   CHOLHDL 3.7 10/08/2020  0357   VLDL 40 10/08/2020 0357   LDLCALC 83 10/08/2020 0357    BNP (last 3 results) No results for input(s): BNP in the last 8760 hours.  HEMOGLOBIN A1C Lab Results  Component Value Date   HGBA1C 5.7 01/16/2014    Cardiac Panel (last 3 results) No results for input(s): CKTOTAL, CKMB, RELINDX in the last 8760 hours.  Invalid input(s): TROPONINHS  No results found for: CKTOTAL, CKMB, CKMBINDEX   TSH No results for input(s): TSH in the last 8760 hours.    CARDIAC DATABASE:  EKG: 10/07/2020: Sinus rhythm at a rate of 79 bpm.  Left bundle branch  block.  ST changes inferior and lateral leads. Cannot exclude ischemia.   Echocardiogram: None  Stress Testing:  None  Heart Catheterization: None  Carotid duplex: None   IMPRESSION & RECOMMENDATIONS: Mary Glass is a 64 y.o. caucasian female whose past medical history and cardiovascular risk factors include: hyperlipidemia, obesity.  Unstable angina pectoris: Patient's symptoms are concerning for coronary etiology particularly in view of ST changes present in her EKG, however cardiac biomarkers are negative.  There are no prior EKGs available for comparison.  In view of patient's symptoms and EKG changes discussed regarding management options including coronary CT, nuclear stress testing, and invasive angiography.  Risks and benefits were discussed and patient prefers to proceed with invasive angiography.   Patient was informed regarding risks and complications of invasive angiography, and wishes to proceed.   Will schedule her for left heart catheterization likely tomorrow.   Will continue guideline directed medical therapy including Asprin, heparin, rosuvastatin.   Add Lopressor 25 mg orally twice daily.  Echocardiogram has also been ordered by primary team, agree with this and will monitor results.  Patient does describe pain as "stabbing" and radiating to her back.  In view of this will obtain D-dimer. If D-dimer comes back positive will obtain CTA of the chest to further evaluate for aortic pathology.   Hyperlipidemia: Patient history of hyperlipidemia, will continue rosuvastatin.   Patient's questions and concerns were addressed to her satisfaction. She voices understanding of the instructions provided during this encounter.   This note was created using a voice recognition software as a result there may be grammatical errors inadvertently enclosed that do not reflect the nature of this encounter. Every attempt is made to correct such errors.  Total encounter time 85  minutes. *Total Encounter Time as defined by the Centers for Medicare and Medicaid Services includes, in addition to the face-to-face time of a patient visit (documented in the note above) non-face-to-face time: obtaining and reviewing outside history, ordering and reviewing medications, tests or procedures, care coordination (communications with other health care professionals or caregivers) and documentation in the medical record.  Lawerance Cruel, [place]  Pager: [phone] Office: [phone] 10/08/2020, 8:14 AM  ADDENDUM:  Shared Visit: Patient was independently interviewed and examined by me. This has been a shared visit with Lawerance Cruel, PA in consultation with Dr. Terri Skains. I reviewed the above and agree the findings and recommendations, unless noted differently below.   Patient presents to the hospital due to substernal chest pain that started earlier yesterday morning and remained progressive.  The patient states that the pain is brought on by effort related activities and does resolve with rest.  The pain is located substernally and over the left sternal border.  No known underlying CAD.  Patient has not had an echocardiogram and stress test in the past either.  Patient's troponin levels are within normal  limits.  But EKG findings are suggestive of dynamic changes.  PHYSICAL EXAM: Vitals with BMI 10/08/2020 10/08/2020 10/08/2020  Height - - -  Weight - - -  BMI - - -  Systolic 95 673 419  Diastolic 72 86 68  Pulse 72 80 77   CONSTITUTIONAL: Well-developed and well-nourished. No acute distress.  SKIN: Skin is warm and dry. No rash noted. No cyanosis. No pallor. No jaundice HEAD: Normocephalic and atraumatic.  EYES: No scleral icterus MOUTH/THROAT: Moist oral membranes.  NECK: No JVD present. No thyromegaly noted. No carotid bruits  LYMPHATIC: No visible cervical adenopathy.  CHEST Normal respiratory effort. No intercostal retractions  LUNGS: Clear to auscultation  bilaterally.  No stridor. No wheezes. No rales.  CARDIOVASCULAR: Regular rate and rhythm, positive F7-T0, soft holosystolic murmur heard at the apex, no gallops or rubs ABDOMINAL: Obese, soft, nontender, nondistended, positive bowel sounds in all 4 quadrants, no apparent ascites.  EXTREMITIES: No peripheral edema  HEMATOLOGIC: No significant bruising NEUROLOGIC: Oriented to person, place, and time. Nonfocal. Normal muscle tone.  PSYCHIATRIC: Normal mood and affect. Normal behavior. Cooperative    12 lead EKG was personally reviewed by me. 10/07/2020: Normal sinus rhythm, 84 bpm, left bundle branch block, ST-T changes most likely secondary to LBBB, compared to prior EKG from 03/26/2020 left bundle branch block is new.  10/07/2020 2134: Normal sinus rhythm, 79 bpm, right axis deviation, IVCD, ST-T changes in the inferior and lateral leads possibly suggestive of inferolateral ischemia or secondary to IVCD.  Cannot rule out lead reversal.   Assessment/Plan: Unstable angina: Patient symptoms are suggestive of unstable angina, high sensitive troponin negative x2, EKG shows dynamic ST-T changes and what appears to be new left bundle branch block.  She also has other cardiovascular risk factors and therefore had a long discussion in regards to ischemic evaluation prior to discharge.  We discussed stress test, coronary CTA, and left heart catheterization.  Patient wishes to proceed with left heart catheterization with possible intervention. The procedure of left heart catheterization with possible intervention was explained to the patient in detail.  The indication, alternatives, risks and benefits were reviewed.  Complications include but not limited to bleeding, infection, vascular injury, stroke, myocardial infection, arrhythmia, kidney injury, radiation-related injury in the case of prolonged fluoroscopy use, emergency cardiac surgery, and death. The patient understands the risks of serious complication  is 1-2 in 2409 with diagnostic cardiac cath and 1-2% or less with angioplasty/stenting.  I have discussed in particular detail the possibility of acute renal failure after coronary angiography particularly if PCI is necessary.  I have described that it is possible patient may need short-term dialysis and the less likely may also require long-term dialysis depending on renal recovery.  I have asked for consent to proceed with coronary angiography understanding the risks of potentially needing dialysis, as well as the risks as stated above.   The patient voices understanding and provides verbal feedback and wishes to proceed with coronary angiography with possible PCI. Recommended initiation of IV heparin yesterday night over the phone when discussed the case with ER physician. Echocardiogram pending. Medications will need to be uptitrated prior to discharge. Recommend checking fasting lipid profile, hemoglobin A1c if already not done so.  Further recommendations to follow as the case evolves.   This note was created using a voice recognition software as a result there may be grammatical errors inadvertently enclosed that do not reflect the nature of this encounter. Every attempt is made to correct such  errors.   Rex Kras, [place], [facility]  Pager: [phone] Office: [phone]

## 2020-10-08 NOTE — Progress Notes (Signed)
  Echocardiogram 2D Echocardiogram has been performed.  Jennette Dubin 10/08/2020, 10:03 AM

## 2020-10-08 NOTE — Progress Notes (Signed)
ANTICOAGULATION CONSULT NOTE   Pharmacy Consult for heparin Indication: chest pain/ACS  Assessment: 64 yo lady to start heparin for CP.  She was not on anticoagulation PTA.  Hg 13.1, PTLC 370 Initial heparin level <0.1 units/ml Goal of Therapy:  Heparin level 0.3-0.7 units/ml Monitor platelets by anticoagulation protocol: Yes   Plan:  Heparin rebolus 4000 units and increase drip to 1300 units/hr Check heparin level ~ 6 hours after rate change Daily HL and CBC while on heparin Monitor for bleeding complications  Mary Glass 10/08/2020,5:37 AM

## 2020-10-08 NOTE — Progress Notes (Signed)
[place] for heparin Indication: chest pain/ACS  Allergies  Allergen Reactions  . Hydrocodone-Acetaminophen     Other reaction(s): Other hyperactivity  . Sumatriptan     Other reaction(s): Other Head pain   . Ambien [Zolpidem Tartrate] Other (See Comments)    headaches  . Cheese Other (See Comments)    Causes migraine  . Codeine Other (See Comments)    hyperactivity  . Benadryl [Diphenhydramine] Rash    Topical only, makes rash worse  . Penicillins Nausea And Vomiting and Rash    Patient Measurements: Height: 5' 5.5" (166.4 cm) Weight: 102.5 kg (226 lb) IBW/kg (Calculated) : 58.15 Heparin Dosing Weight: 81.6 kg  Vital Signs: Temp: 97.4 F (36.3 C) (11/15 2000) Temp Source: Oral (11/15 2000) BP: 140/74 (11/15 2000) Pulse Rate: 84 (11/15 2000)  Labs: Recent Labs    10/07/20 1841 10/07/20 2207 10/08/20 0357 10/08/20 1413 10/08/20 2200  HGB 13.1  --   --   --   --   HCT 42.2  --   --   --   --   PLT 370  --   --   --   --   HEPARINUNFRC  --   --  <0.10* 0.12* 0.28*  CREATININE 1.01*  --  0.93  --   --   TROPONINIHS 6 6  --   --   --     Estimated Creatinine Clearance: 74.2 mL/min (by C-G formula based on SCr of 0.93 mg/dL).   Medical History: Past Medical History:  Diagnosis Date  . Allergy   . Anxiety   . Arthritis   . Depression   . GERD (gastroesophageal reflux disease)   . Hyperlipidemia   . Migraine     Medications:  See medication history  Assessment: 64 yo lady to start heparin for CP.  She was not on anticoagulation PTA.  Hg 13.1, PTLC 370  Heparin level 0.28 is subtherapeutic on heparin 1500 units/hr. Per RN no issues with IV infusion or access. No reported bleeding.   Goal of Therapy:  Heparin level 0.3-0.7 units/ml Monitor platelets by anticoagulation protocol: Yes   Plan:  Increase heparin to 1600 units/hr  Check heparin level at [place], PharmD Clinical Pharmacist  10/08/2020  10:22 PM

## 2020-10-08 NOTE — ED Notes (Signed)
Patient's oxygen saturation drops into the high 80's while sleeping. 1L of oxygen applied to patient.

## 2020-10-08 NOTE — ED Notes (Signed)
Verbal order from Dr. Dwyane Dee for soft bland diet for tonight. NPO at midnight.

## 2020-10-08 NOTE — H&P (View-Only) (Signed)
CARDIOLOGY CONSULT NOTE  Patient ID: Mary Glass MRN: [id number] DOB/AGE: 08-15-1956 64 y.o.  Admit date: 10/07/2020 Attending physician: Lenore Cordia, MD Primary Physician:  [facility] Cardiologist: None Inpatient Cardiologist: Mechele Claude, [facility]  Chief complaint: Chest pain  Reason of Consultation: Anginal symptoms with EKG changes   HPI:  Mary Glass is a 64 y.o. Caucasian female who presents with a chief complaint of " chest pain." Her past medical history and cardiovascular risk factors include: Hyperlipidemia, obesity.  She denies history of hypertension, diabetes mellitus, MI, CVA, TIA, blood clots, or cancer. Denies history of tobacco, alcohol, or illicit drug use.   Patient presented to [facility] emergency department 10/07/2020 with chief complaint of chest pain.  She reports yesterday around breakfast she began feeling substernal chest discomfort which she describes as stabbing with 8/10 severity.  Pain radiates to her back.  Patient states this chest pain is worse with exertion and improves with rest denies associated shortness of breath, dizziness, syncope, near syncope.  She does report some mild nausea yesterday afternoon which has since improved.    Upon presentation in the emergency department, patient's EKG revealed left bundle branch block and ST changes in the inferior leads concerning for potential acute coronary syndrome.  Troponins in the emergency department were negative x2.  Cardiology was consulted due to patient's symptoms concerning for coronary artery disease and EKG changes.  Patient was started on IV heparin and morphine, as well as aspirin.  Patient seen and examined at 07:45 this morning, there is no family at bedside.  She reports since admission to the emergency department and treatment chest pain has improved to 3/10 severity.  ALLERGIES: Allergies  Allergen Reactions  . Ambien [Zolpidem Tartrate]  Other (See Comments)    headaches  . Codeine   . Benadryl [Diphenhydramine] Rash    Topical only, makes rash worse  . Penicillins Nausea And Vomiting and Rash    PAST MEDICAL HISTORY: Past Medical History:  Diagnosis Date  . Allergy   . Anxiety   . Arthritis   . Depression   . GERD (gastroesophageal reflux disease)   . Hyperlipidemia   . Migraine     PAST SURGICAL HISTORY: Past Surgical History:  Procedure Laterality Date  . ABDOMINAL HYSTERECTOMY  1995  . FOOT SURGERY      FAMILY HISTORY: The patient family history includes Arthritis in her mother and paternal grandmother; Cancer in her maternal grandmother; Colon cancer (age of onset: 20) in her mother; Heart disease in her father and paternal grandmother; Hypertension in her brother; Parkinson's disease in her father.   SOCIAL HISTORY:  The patient  reports that she has never smoked. She has never used smokeless tobacco. She reports that she does not drink alcohol and does not use drugs.  MEDICATIONS: Current Outpatient Medications  Medication Instructions  . amitriptyline (ELAVIL) 75 mg, Oral, Daily at bedtime  . buPROPion (WELLBUTRIN XL) 150 mg, Oral, Daily, PATIENT NEEDS OFFICE VISIT FOR ADDITIONAL REFILLS  . cetirizine (ZYRTEC) 10 mg, Daily  . Efinaconazole 10 % SOLN 1 application, Apply externally, Daily  . Glucosamine-Chondroitin (GLUCOSAMINE CHONDR COMPLEX PO) 1 capsule, Daily  . KRILL OIL PO 1 capsule, 2 times daily  . methocarbamol (ROBAXIN) 500 MG tablet TAKE 1 TABLET (500 MG TOTAL) BY MOUTH EVERY 8 (EIGHT) HOURS AS NEEDED FOR MUSCLE SPASMS.  Marland Kitchen omeprazole (PRILOSEC) 20 mg, Oral, 2 times daily before meals, MUST SCHEDULE ANNUAL EXAM  . PARoxetine (PAXIL) 30  MG tablet TAKE 1 TABLET (30 MG TOTAL) BY MOUTH DAILY. MUST SCHEDULE ANNUAL PHYSICAL FOR FURTHER REFILLS  . rizatriptan (MAXALT) 5 mg, Oral, As needed, May repeat in 2 hours if needed  . rosuvastatin (CRESTOR) 10 MG tablet TAKE 1 TABLET (10 MG TOTAL) BY  MOUTH DAILY. MUST SCHEDULE ANNUAL PHYSICAL FOR FURTHER REFILLS  . terbinafine (LAMISIL) 250 mg, Oral, Daily  . topiramate (TOPAMAX) 100 mg, At bedtime PRN  . traMADol (ULTRAM) 50 mg, Oral, Every 8 hours PRN   Review of Systems  Constitutional: Negative for malaise/fatigue and weight gain.  HENT: Negative.   Eyes: Negative.   Cardiovascular: Positive for chest pain. Negative for claudication, dyspnea on exertion, leg swelling, near-syncope, orthopnea, palpitations, paroxysmal nocturnal dyspnea and syncope.  Respiratory: Negative.  Negative for shortness of breath.   Endocrine: Negative.   Hematologic/Lymphatic: Does not bruise/bleed easily.  Skin: Negative.   Musculoskeletal: Positive for back pain.  Gastrointestinal: Positive for nausea. Negative for constipation, diarrhea, melena and vomiting.  Genitourinary: Negative.   Neurological: Negative for dizziness, light-headedness and weakness.  Psychiatric/Behavioral: Negative.   Allergic/Immunologic: Negative.   All other systems reviewed and are negative.   PHYSICAL EXAM: Vitals with BMI 10/08/2020 10/08/2020 10/08/2020  Height - - -  Weight - - -  BMI - - -  Systolic 408 144 818  Diastolic 71 62 57  Pulse 85 86 88    No intake or output data in the 24 hours ending 10/08/20 0814  Net IO Since Admission: No IO data has been entered for this period [10/08/20 0814]  CONSTITUTIONAL: Well-developed and well-nourished. No acute distress. Obese.   SKIN: Skin is warm and dry. No rash noted. No cyanosis. No pallor. No jaundice HEAD: Normocephalic and atraumatic.  EYES: No scleral icterus MOUTH/THROAT: Moist oral membranes.  NECK: No JVD present. No thyromegaly noted. No carotid bruits  LYMPHATIC: No visible cervical adenopathy.  CHEST Normal respiratory effort. No intercostal retractions  LUNGS: Clear to ascultation bilaterally. No stridor. No wheezes. No rales.  CARDIOVASCULAR: Regular rate and rhythm. Positive S1, S2. No murmurs,  rubs, or gallops appreciated.  ABDOMINAL: No apparent ascites.  EXTREMITIES: No peripheral edema. Radial pulses 2+ bilaterally. DP pulses 2+ bilaterally.  HEMATOLOGIC: No significant bruising NEUROLOGIC: Oriented to person, place, and time. Nonfocal. Normal muscle tone.  PSYCHIATRIC: Normal mood and affect. Normal behavior. Cooperative  RADIOLOGY: DG Chest 2 View  Result Date: 10/07/2020 CLINICAL DATA:  Left-sided chest pain with cough. EXAM: CHEST - 2 VIEW COMPARISON:  02/26/2004 FINDINGS: The heart size and mediastinal contours are within normal limits. There are few nodular densities in the left mid lung zone that are stable across multiple prior studies dating back to 2005. Otherwise, the lungs are clear. The visualized skeletal structures are unremarkable. IMPRESSION: No active cardiopulmonary disease. Electronically Signed   By: Constance Holster M.D.   On: 10/07/2020 19:04    LABORATORY DATA: Lab Results  Component Value Date   WBC 12.8 (H) 10/07/2020   HGB 13.1 10/07/2020   HCT 42.2 10/07/2020   MCV 83.9 10/07/2020   PLT 370 10/07/2020    Recent Labs  Lab 10/08/20 0357  NA 135  K 3.7  CL 102  CO2 23  BUN 13  CREATININE 0.93  CALCIUM 8.8*  GLUCOSE 103*    Lipid Panel     Component Value Date/Time   CHOL 168 10/08/2020 0357   TRIG 202 (H) 10/08/2020 0357   HDL 45 10/08/2020 0357   CHOLHDL 3.7 10/08/2020  0357   VLDL 40 10/08/2020 0357   LDLCALC 83 10/08/2020 0357    BNP (last 3 results) No results for input(s): BNP in the last 8760 hours.  HEMOGLOBIN A1C Lab Results  Component Value Date   HGBA1C 5.7 01/16/2014    Cardiac Panel (last 3 results) No results for input(s): CKTOTAL, CKMB, RELINDX in the last 8760 hours.  Invalid input(s): TROPONINHS  No results found for: CKTOTAL, CKMB, CKMBINDEX   TSH No results for input(s): TSH in the last 8760 hours.    CARDIAC DATABASE:  EKG: 10/07/2020: Sinus rhythm at a rate of 79 bpm.  Left bundle branch  block.  ST changes inferior and lateral leads. Cannot exclude ischemia.   Echocardiogram: None  Stress Testing:  None  Heart Catheterization: None  Carotid duplex: None   IMPRESSION & RECOMMENDATIONS: Thersea Manfredonia is a 63 y.o. caucasian female whose past medical history and cardiovascular risk factors include: hyperlipidemia, obesity.  Unstable angina pectoris: Patient's symptoms are concerning for coronary etiology particularly in view of ST changes present in her EKG, however cardiac biomarkers are negative.  There are no prior EKGs available for comparison.  In view of patient's symptoms and EKG changes discussed regarding management options including coronary CT, nuclear stress testing, and invasive angiography.  Risks and benefits were discussed and patient prefers to proceed with invasive angiography.   Patient was informed regarding risks and complications of invasive angiography, and wishes to proceed.   Will schedule her for left heart catheterization likely tomorrow.   Will continue guideline directed medical therapy including Asprin, heparin, rosuvastatin.   Add Lopressor 25 mg orally twice daily.  Echocardiogram has also been ordered by primary team, agree with this and will monitor results.  Patient does describe pain as "stabbing" and radiating to her back.  In view of this will obtain D-dimer. If D-dimer comes back positive will obtain CTA of the chest to further evaluate for aortic pathology.   Hyperlipidemia: Patient history of hyperlipidemia, will continue rosuvastatin.   Patient's questions and concerns were addressed to her satisfaction. She voices understanding of the instructions provided during this encounter.   This note was created using a voice recognition software as a result there may be grammatical errors inadvertently enclosed that do not reflect the nature of this encounter. Every attempt is made to correct such errors.  Total encounter time 85  minutes. *Total Encounter Time as defined by the Centers for Medicare and Medicaid Services includes, in addition to the face-to-face time of a patient visit (documented in the note above) non-face-to-face time: obtaining and reviewing outside history, ordering and reviewing medications, tests or procedures, care coordination (communications with other health care professionals or caregivers) and documentation in the medical record.  Lawerance Cruel, [place]  Pager: [phone] Office: [phone] 10/08/2020, 8:14 AM  ADDENDUM:  Shared Visit: Patient was independently interviewed and examined by me. This has been a shared visit with Lawerance Cruel, PA in consultation with Dr. Terri Skains. I reviewed the above and agree the findings and recommendations, unless noted differently below.   Patient presents to the hospital due to substernal chest pain that started earlier yesterday morning and remained progressive.  The patient states that the pain is brought on by effort related activities and does resolve with rest.  The pain is located substernally and over the left sternal border.  No known underlying CAD.  Patient has not had an echocardiogram and stress test in the past either.  Patient's troponin levels are within normal  limits.  But EKG findings are suggestive of dynamic changes.  PHYSICAL EXAM: Vitals with BMI 10/08/2020 10/08/2020 10/08/2020  Height - - -  Weight - - -  BMI - - -  Systolic 95 782 423  Diastolic 72 86 68  Pulse 72 80 77   CONSTITUTIONAL: Well-developed and well-nourished. No acute distress.  SKIN: Skin is warm and dry. No rash noted. No cyanosis. No pallor. No jaundice HEAD: Normocephalic and atraumatic.  EYES: No scleral icterus MOUTH/THROAT: Moist oral membranes.  NECK: No JVD present. No thyromegaly noted. No carotid bruits  LYMPHATIC: No visible cervical adenopathy.  CHEST Normal respiratory effort. No intercostal retractions  LUNGS: Clear to auscultation  bilaterally.  No stridor. No wheezes. No rales.  CARDIOVASCULAR: Regular rate and rhythm, positive N3-I1, soft holosystolic murmur heard at the apex, no gallops or rubs ABDOMINAL: Obese, soft, nontender, nondistended, positive bowel sounds in all 4 quadrants, no apparent ascites.  EXTREMITIES: No peripheral edema  HEMATOLOGIC: No significant bruising NEUROLOGIC: Oriented to person, place, and time. Nonfocal. Normal muscle tone.  PSYCHIATRIC: Normal mood and affect. Normal behavior. Cooperative    12 lead EKG was personally reviewed by me. 10/07/2020: Normal sinus rhythm, 84 bpm, left bundle branch block, ST-T changes most likely secondary to LBBB, compared to prior EKG from 03/26/2020 left bundle branch block is new.  10/07/2020 2134: Normal sinus rhythm, 79 bpm, right axis deviation, IVCD, ST-T changes in the inferior and lateral leads possibly suggestive of inferolateral ischemia or secondary to IVCD.  Cannot rule out lead reversal.   Assessment/Plan: Unstable angina: Patient symptoms are suggestive of unstable angina, high sensitive troponin negative x2, EKG shows dynamic ST-T changes and what appears to be new left bundle branch block.  She also has other cardiovascular risk factors and therefore had a long discussion in regards to ischemic evaluation prior to discharge.  We discussed stress test, coronary CTA, and left heart catheterization.  Patient wishes to proceed with left heart catheterization with possible intervention. The procedure of left heart catheterization with possible intervention was explained to the patient in detail.  The indication, alternatives, risks and benefits were reviewed.  Complications include but not limited to bleeding, infection, vascular injury, stroke, myocardial infection, arrhythmia, kidney injury, radiation-related injury in the case of prolonged fluoroscopy use, emergency cardiac surgery, and death. The patient understands the risks of serious complication  is 1-2 in 4431 with diagnostic cardiac cath and 1-2% or less with angioplasty/stenting.  I have discussed in particular detail the possibility of acute renal failure after coronary angiography particularly if PCI is necessary.  I have described that it is possible patient may need short-term dialysis and the less likely may also require long-term dialysis depending on renal recovery.  I have asked for consent to proceed with coronary angiography understanding the risks of potentially needing dialysis, as well as the risks as stated above.   The patient voices understanding and provides verbal feedback and wishes to proceed with coronary angiography with possible PCI. Recommended initiation of IV heparin yesterday night over the phone when discussed the case with ER physician. Echocardiogram pending. Medications will need to be uptitrated prior to discharge. Recommend checking fasting lipid profile, hemoglobin A1c if already not done so.  Further recommendations to follow as the case evolves.   This note was created using a voice recognition software as a result there may be grammatical errors inadvertently enclosed that do not reflect the nature of this encounter. Every attempt is made to correct such  errors.   Rex Kras, [place], [facility]  Pager: [phone] Office: [phone]

## 2020-10-08 NOTE — ED Notes (Signed)
Patient ambulated to restroom.

## 2020-10-09 ENCOUNTER — Encounter (HOSPITAL_COMMUNITY): Payer: Self-pay | Admitting: Cardiology

## 2020-10-09 ENCOUNTER — Encounter (HOSPITAL_COMMUNITY)
Admission: EM | Disposition: A | Discharge status: Home / Self Care | Payer: Self-pay | Source: Home / Self Care | Attending: Family Medicine

## 2020-10-09 DIAGNOSIS — I200 Unstable angina: Secondary | ICD-10-CM

## 2020-10-09 HISTORY — PX: INTRAVASCULAR PRESSURE WIRE/FFR STUDY: CATH118243

## 2020-10-09 HISTORY — PX: LEFT HEART CATH AND CORONARY ANGIOGRAPHY: CATH118249

## 2020-10-09 LAB — CBC
HCT: 39.9 % (ref 36.0–46.0)
Hemoglobin: 12.3 g/dL (ref 12.0–15.0)
MCH: 25.8 pg — ABNORMAL LOW (ref 26.0–34.0)
MCHC: 30.8 g/dL (ref 30.0–36.0)
MCV: 83.8 fL (ref 80.0–100.0)
Platelets: 353 10*3/uL (ref 150–400)
RBC: 4.76 MIL/uL (ref 3.87–5.11)
RDW: 14.6 % (ref 11.5–15.5)
WBC: 10.1 10*3/uL (ref 4.0–10.5)
nRBC: 0 % (ref 0.0–0.2)

## 2020-10-09 LAB — HEPARIN LEVEL (UNFRACTIONATED): Heparin Unfractionated: 0.47 IU/mL (ref 0.30–0.70)

## 2020-10-09 LAB — POCT ACTIVATED CLOTTING TIME: Activated Clotting Time: 329 seconds

## 2020-10-09 SURGERY — LEFT HEART CATH AND CORONARY ANGIOGRAPHY
Anesthesia: LOCAL

## 2020-10-09 MED ORDER — HYDRALAZINE HCL 20 MG/ML IJ SOLN: 10.0000 mg | INTRAMUSCULAR | Status: DC | PRN

## 2020-10-09 MED ORDER — NITROGLYCERIN 0.4 MG SL SUBL: 0.4000 mg | SUBLINGUAL_TABLET | SUBLINGUAL | 2 refills | Status: DC | PRN

## 2020-10-09 MED ORDER — LABETALOL HCL 5 MG/ML IV SOLN: 10.0000 mg | INTRAVENOUS | Status: DC | PRN

## 2020-10-09 MED ORDER — LIDOCAINE HCL (PF) 1 % IJ SOLN
INTRAMUSCULAR | Status: AC
  Filled 2020-10-09: qty 30

## 2020-10-09 MED ORDER — FENTANYL CITRATE (PF) 100 MCG/2ML IJ SOLN
INTRAMUSCULAR | Status: DC | PRN
  Administered 2020-10-09: 50 ug via INTRAVENOUS
  Administered 2020-10-09: 25 ug via INTRAVENOUS

## 2020-10-09 MED ORDER — SODIUM CHLORIDE 0.9% FLUSH: 3.0000 mL | INTRAVENOUS | Status: DC | PRN

## 2020-10-09 MED ORDER — VERAPAMIL HCL 2.5 MG/ML IV SOLN
INTRAVENOUS | Status: AC
  Filled 2020-10-09: qty 2

## 2020-10-09 MED ORDER — HEPARIN SODIUM (PORCINE) 1000 UNIT/ML IJ SOLN
INTRAMUSCULAR | Status: DC | PRN
  Administered 2020-10-09 (×2): 5000 [IU] via INTRAVENOUS

## 2020-10-09 MED ORDER — HEPARIN (PORCINE) IN NACL 1000-0.9 UT/500ML-% IV SOLN
INTRAVENOUS | Status: AC
  Filled 2020-10-09: qty 1000

## 2020-10-09 MED ORDER — HEPARIN SODIUM (PORCINE) 1000 UNIT/ML IJ SOLN
INTRAMUSCULAR | Status: AC
  Filled 2020-10-09: qty 1

## 2020-10-09 MED ORDER — ASPIRIN 81 MG PO TBEC
81.0000 mg | DELAYED_RELEASE_TABLET | Freq: Every day | ORAL | 11 refills | Status: DC
Start: 2020-10-10 — End: 2021-10-03

## 2020-10-09 MED ORDER — SODIUM CHLORIDE 0.9 % IV SOLN: 250.0000 mL | INTRAVENOUS | Status: DC | PRN

## 2020-10-09 MED ORDER — METOPROLOL SUCCINATE ER 25 MG PO TB24: 25.0000 mg | ORAL_TABLET | Freq: Every day | ORAL | Status: DC

## 2020-10-09 MED ORDER — SODIUM CHLORIDE 0.9 % IV SOLN: INTRAVENOUS | Status: AC

## 2020-10-09 MED ORDER — SODIUM CHLORIDE 0.9% FLUSH: 3.0000 mL | Freq: Two times a day (BID) | INTRAVENOUS | Status: DC

## 2020-10-09 MED ORDER — METOPROLOL SUCCINATE ER 25 MG PO TB24
25.0000 mg | ORAL_TABLET | Freq: Every day | ORAL | 2 refills | Status: DC
Start: 2020-10-09 — End: 2021-01-07

## 2020-10-09 MED ORDER — CLOPIDOGREL BISULFATE 75 MG PO TABS
75.0000 mg | ORAL_TABLET | Freq: Every day | ORAL | Status: DC
  Administered 2020-10-09: 75 mg via ORAL
  Filled 2020-10-09: qty 1

## 2020-10-09 MED ORDER — NITROGLYCERIN 1 MG/10 ML FOR IR/CATH LAB
INTRA_ARTERIAL | Status: AC
  Filled 2020-10-09: qty 10

## 2020-10-09 MED ORDER — IOHEXOL 350 MG/ML SOLN
INTRAVENOUS | Status: DC | PRN
  Administered 2020-10-09: 50 mL

## 2020-10-09 MED ORDER — LIDOCAINE HCL (PF) 1 % IJ SOLN
INTRAMUSCULAR | Status: DC | PRN
  Administered 2020-10-09: 2 mL

## 2020-10-09 MED ORDER — MIDAZOLAM HCL 2 MG/2ML IJ SOLN
INTRAMUSCULAR | Status: DC | PRN
  Administered 2020-10-09 (×2): 1 mg via INTRAVENOUS

## 2020-10-09 MED ORDER — NITROGLYCERIN 1 MG/10 ML FOR IR/CATH LAB
INTRA_ARTERIAL | Status: DC | PRN
  Administered 2020-10-09: 200 ug via INTRACORONARY

## 2020-10-09 MED ORDER — ONDANSETRON HCL 4 MG/2ML IJ SOLN: 4.0000 mg | Freq: Four times a day (QID) | INTRAMUSCULAR | Status: DC | PRN

## 2020-10-09 MED ORDER — ACETAMINOPHEN 325 MG PO TABS: 650.0000 mg | ORAL_TABLET | ORAL | Status: DC | PRN

## 2020-10-09 MED ORDER — ROSUVASTATIN CALCIUM 10 MG PO TABS
10.0000 mg | ORAL_TABLET | Freq: Every day | ORAL | 2 refills | Status: DC
Start: 2020-10-09 — End: 2023-05-05

## 2020-10-09 MED ORDER — CLOPIDOGREL BISULFATE 75 MG PO TABS
75.0000 mg | ORAL_TABLET | Freq: Every day | ORAL | 2 refills | Status: DC
Start: 2020-10-09 — End: 2021-01-07

## 2020-10-09 MED ORDER — VERAPAMIL HCL 2.5 MG/ML IV SOLN
INTRAVENOUS | Status: DC | PRN
  Administered 2020-10-09: 10 mL via INTRA_ARTERIAL

## 2020-10-09 MED ORDER — MIDAZOLAM HCL 2 MG/2ML IJ SOLN
INTRAMUSCULAR | Status: AC
  Filled 2020-10-09: qty 2

## 2020-10-09 MED ORDER — SODIUM CHLORIDE 0.9 % IV SOLN: INTRAVENOUS | Status: DC

## 2020-10-09 MED ORDER — HEPARIN (PORCINE) IN NACL 1000-0.9 UT/500ML-% IV SOLN
INTRAVENOUS | Status: DC | PRN
  Administered 2020-10-09 (×2): 500 mL

## 2020-10-09 MED ORDER — PANTOPRAZOLE SODIUM 40 MG PO TBEC
40.0000 mg | DELAYED_RELEASE_TABLET | Freq: Every day | ORAL | Status: DC
  Administered 2020-10-09: 40 mg via ORAL
  Filled 2020-10-09: qty 1

## 2020-10-09 MED ORDER — ASPIRIN 81 MG PO CHEW
81.0000 mg | CHEWABLE_TABLET | ORAL | Status: AC
  Administered 2020-10-09: 81 mg via ORAL
  Filled 2020-10-09: qty 1

## 2020-10-09 MED ORDER — FENTANYL CITRATE (PF) 100 MCG/2ML IJ SOLN
INTRAMUSCULAR | Status: AC
  Filled 2020-10-09: qty 2

## 2020-10-09 MED FILL — CLOPIDOGREL 75 MG TABLET: 75 | 30 days supply | Qty: 30 | Fill #0

## 2020-10-09 MED FILL — METOPROLOL SUCCINATE ER 25: 25 | 30 days supply | Qty: 30 | Fill #0

## 2020-10-09 MED FILL — NITROGLYCERIN 0.4 MG TAB SL: 0.4 | 7 days supply | Qty: 25 | Fill #0

## 2020-10-09 MED FILL — ASPIRIN LOW DOSE 81 MG TBEC: 81 | 30 days supply | Qty: 30 | Fill #0

## 2020-10-09 SURGICAL SUPPLY — 13 items
CATH LAUNCHER 6FR EBU 3 (CATHETERS) ×1 IMPLANT
CATH OPTITORQUE TIG 4.0 5F (CATHETERS) ×1 IMPLANT
DEVICE RAD COMP TR BAND LRG (VASCULAR PRODUCTS) ×1 IMPLANT
GLIDESHEATH SLEND A-KIT 6F 22G (SHEATH) ×1 IMPLANT
GUIDEWIRE INQWIRE 1.5J.035X260 (WIRE) IMPLANT
GUIDEWIRE PRESSURE COMET II (WIRE) ×1 IMPLANT
INQWIRE 1.5J .035X260CM (WIRE) ×2
KIT HEART LEFT (KITS) ×2 IMPLANT
KIT HEMO VALVE WATCHDOG (MISCELLANEOUS) ×1 IMPLANT
PACK CARDIAC CATHETERIZATION (CUSTOM PROCEDURE TRAY) ×2 IMPLANT
SHEATH PROBE COVER 6X72 (BAG) ×1 IMPLANT
TRANSDUCER W/STOPCOCK (MISCELLANEOUS) ×2 IMPLANT
TUBING CIL FLEX 10 FLL-RA (TUBING) ×2 IMPLANT

## 2020-10-09 NOTE — Discharge Summary (Addendum)
Physician Discharge Summary  Mary Glass ZDG:[id number] DOB: 26-Feb-1956 DOA: 10/07/2020  [facility] date: 10/07/2020   Discharge date: 10/09/2020  Admitted From:  Home.  Disposition:   Home.  Recommendations for Outpatient Follow-up:  1. Follow up with PCP in 1-2 weeks. 2. Please obtain BMP/CBC in one week. 3. Advised to take dual antiplatelet therapy ( aspirin and Plavix) for next 6 months. 4. Advised to follow-up with cardiology as scheduled. 5. Patient was admitted for unstable angina,  underwent left heart cath found to have plaque,  did not require stenting.  Home Health: None Equipment/Devices: None  Discharge Condition: Stable CODE STATUS:DNR Diet recommendation: Heart Healthy   Brief Summary / Hospital course: This 64 years old female with medical history significant for hyperlipidemia, GERD, depression, anxiety, migraine presents to the ED with complaints of Chest pain.  Patient reports having chest pain while at rest.  She describes pain as constant, stabbing sensation radiating towards the back.  She has normal troponins but EKG shows sinus rhythm with LBBB and nonspecific T wave changes in inferior leads.  Patient was started on heparin gtt.  Cardiology consulted thinks patient might be having unstable angina pectoris recommended left heart catheterization.  Patient agreed. 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  Cardiology recommended dual antiplatelet therapy with aspirin and Plavix for possibility of plaque erosion. Advised high intensity statin (rosuvastatin 10 mg) and beta blocker (metoprolol succinate 25 mg). Currently, not in clinical heart failure.  Repeat echocardiogram in 3 months.  Patient reports feeling better,  chest pain has resolved. Patient is cleared from cardiology to be discharged.  Patient will follow  up with cardiology in 1 to 2 weeks.  She was managed for below problems.  Discharge Diagnoses:  Principal Problem:   Chest pain Active Problems:   Migraine   HLD (hyperlipidemia)   Unstable angina (HCC)  Chest pain could be unstable Angina: Patient with atypical chest pain. Symptoms are reproducible on exam.  EKG shows left bundle branch block. Troponin is negative x2.   EDP discussed with on-call cardiology who recommended heparin anticoagulation  Continue heparin drip -Started on aspirin 81 mg daily -Continue rosuvastatin -Echocardiogram completed report pending. Cardiology consult appreciated thinks patient might be having unstable angina  recommended left heart catheterization, metoprolol 25 mg twice daily. 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   Hyperlipidemia: Continue rosuvastatin.  Depression/anxiety: Continue bupropion.  Migraine disorder: Continue Elavil.  Discharge Instructions  Discharge Instructions    Call MD for:  difficulty breathing, headache or visual disturbances   Complete by: As directed    Call MD for:  persistant dizziness or light-headedness   Complete by: As directed    Call MD for:  persistant nausea and vomiting   Complete by: As directed    Diet - low sodium heart healthy   Complete by: As directed    Diet Carb Modified   Complete by: As directed    Discharge instructions   Complete by: As directed    Advised to follow-up with primary care physician in 1 week. Advised to take dual antiplatelet medication( aspirin and Plavix) for next 6 months. Advised to follow-up with cardiology as scheduled.   Increase activity slowly   Complete by: As directed      Allergies as of 10/09/2020  Reactions   Hydrocodone-acetaminophen    Other reaction(s): Other hyperactivity   Sumatriptan    Other reaction(s): Other Head pain    Ambien [zolpidem Tartrate] Other (See  Comments)   headaches   Cheese Other (See Comments)   Causes migraine   Codeine Other (See Comments)   hyperactivity   Benadryl [diphenhydramine] Rash   Topical only, makes rash worse   Penicillins Nausea And Vomiting, Rash      Medication List    STOP taking these medications   methocarbamol 500 MG tablet Commonly known as: ROBAXIN   montelukast 10 MG tablet Commonly known as: SINGULAIR   omeprazole 20 MG capsule Commonly known as: PRILOSEC   traMADol 50 MG tablet Commonly known as: ULTRAM     TAKE these medications   amitriptyline 75 MG tablet Commonly known as: ELAVIL Take 1 tablet (75 mg total) by mouth at bedtime.   aspirin 81 MG EC tablet Take 1 tablet (81 mg total) by mouth daily. Swallow whole. Start taking on: October 10, 2020   b complex vitamins capsule Take 1 capsule by mouth daily.   buPROPion 150 MG 24 hr tablet Commonly known as: WELLBUTRIN XL Take 1 tablet (150 mg total) by mouth daily. PATIENT NEEDS OFFICE VISIT FOR ADDITIONAL REFILLS   clopidogrel 75 MG tablet Commonly known as: PLAVIX Take 1 tablet (75 mg total) by mouth daily.   fluticasone 50 MCG/ACT nasal spray Commonly known as: FLONASE Place 2 sprays into both nostrils daily as needed for allergies or rhinitis.   hydrOXYzine 50 MG tablet Commonly known as: ATARAX/VISTARIL Take 50 mg by mouth at bedtime.   KRILL OIL PO Take 1 capsule by mouth daily.   metoprolol succinate 25 MG 24 hr tablet Commonly known as: TOPROL-XL Take 1 tablet (25 mg total) by mouth daily.   MOVE FREE PO Take 1 tablet by mouth daily.   nitroGLYCERIN 0.4 MG SL tablet Commonly known as: NITROSTAT Place 1 tablet (0.4 mg total) under the tongue every 5 (five) minutes as needed for chest pain (Up to 3 doses in 15 minutes.).   OCUVITE ADULT 50+ PO Take 1 tablet by mouth daily.   OVER THE COUNTER MEDICATION Take 1 tablet by mouth daily. Garden of Life Probiotic for Women   PARoxetine 30 MG  tablet Commonly known as: PAXIL TAKE 1 TABLET (30 MG TOTAL) BY MOUTH DAILY. MUST SCHEDULE ANNUAL PHYSICAL FOR FURTHER REFILLS What changed: See the new instructions.   RABEprazole 20 MG tablet Commonly known as: ACIPHEX Take 20 mg by mouth 2 (two) times daily.   rizatriptan 10 MG tablet Commonly known as: MAXALT Take 10 mg by mouth as needed for migraine. What changed: Another medication with the same name was removed. Continue taking this medication, and follow the directions you see here.   rosuvastatin 10 MG tablet Commonly known as: CRESTOR Take 1 tablet (10 mg total) by mouth at bedtime. What changed: See the new instructions.       Follow-up Information    Rex Kras, DO Follow up on 10/16/2020.   Specialties: Cardiology, Radiology, Vascular Surgery Why: 10:45 PM Contact information: [place] [address] [place], [facility]   Specialty: [facility] Medicine Contact information: [address] [place] [address] [phone]              Allergies  Allergen Reactions  . Hydrocodone-Acetaminophen  Other reaction(s): Other hyperactivity  . Sumatriptan     Other reaction(s): Other Head pain   . Ambien [Zolpidem Tartrate] Other (See Comments)    headaches  . Cheese Other (See Comments)    Causes migraine  . Codeine Other (See Comments)    hyperactivity  . Benadryl [Diphenhydramine] Rash    Topical only, makes rash worse  . Penicillins Nausea And Vomiting and Rash    Consultations:  Cardiology   Procedures/Studies: DG Chest 2 View  Result Date: 10/07/2020 CLINICAL DATA:  Left-sided chest pain with cough. EXAM: CHEST - 2 VIEW COMPARISON:  02/26/2004 FINDINGS: The heart size and mediastinal contours are within normal limits. There are few nodular densities in the left mid lung zone that are stable across multiple prior studies dating back to 2005.  Otherwise, the lungs are clear. The visualized skeletal structures are unremarkable. IMPRESSION: No active cardiopulmonary disease. Electronically Signed   By: Constance Holster M.D.   On: 10/07/2020 19:04   CARDIAC CATHETERIZATION  Addendum Date: 10/09/2020   LM: Normal LAD: Type 1 vessel that does not reach apex         Focal 50% LAD stenosis without involvement of adjacent diagonal branch Ramus: Normal LCx: Normal RCA: Large, super-dominant, supplies apex. Normal vessel Patient's presentation was with unstable angina, but without any troponin elevation, suggesting that she did not have any infarct.  Therefore, I performed DFR of this moderate lesion, which was physiologically nonsignificant at 0.93.  However, echocardiogram shows pattern suggestive of wraparound LAD infarct.  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 No stent was placed.  Recommend dual antiplatelet therapy with aspirin and Plavix for possibility of plaque erosion.  Also recommend high intensity statin (rosuvastatin 10 mg) and beta blocker (metoprolol succinate 25 mg). Currently, not in clinical heart failure.  Reasonable to hold off guideline directed heart failure therapy.  Repeat echocardiogram in 3 months.  If EF remains low, and if clinically indicated, could start on guideline directed heart failure therapy. Nigel Mormon, MD Pager: [phone] Office: [phone]   Addendum Date: 10/09/2020   LM: Normal LAD: Type 1 vessel that does not reach apex         Focal 50% LAD stenosis without involvement of adjacent diagonal branch Ramus: Normal LCx: Normal RCA: Large, super-dominant, supplies apex. Normal vessel Patient's presentation was with unstable angina, but without any troponin elevation, suggesting that she did not have any infarct.  Therefore, I performed DFR of this moderate lesion, which  was physiologically nonsignificant at 0.93.  However, echocardiogram shows pattern suggestive of wraparound LAD infarct.  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 No stent was placed.  Recommend dual antiplatelet therapy with aspirin and Plavix for possibility of plaque erosion.  Also recommend high intensity statin (rosuvastatin 10 mg) and beta blocker (metoprolol succinate 25 mg). Currently, not in clinical heart failure.  Reasonable to hold off guideline directed heart failure therapy.  Repeat echocardiogram in 3 months.  If EF remains low, and if clinically indicated, could start on guideline directed heart failure therapy. Nigel Mormon, MD Pager: [phone] Office: [phone]   Result Date: 10/09/2020 LM: Normal LAD: Type 1 vessel that does not reach apex         Focal 50% LAD stenosis without involvement of adjacent  diagonal branch Ramus: Normal LCx: Normal RCA: Large, super-dominant, supplies apex. Normal vessel Patient's presentation was with unstable angina, but without any troponin elevation, suggesting that she did not have any infarct.  Therefore, I performed DFR of this moderate lesion, which was physiologically nonsignificant at 0.93.  However, echocardiogram shows pattern suggestive of wraparound LAD infarct.  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 No stent was placed.  Recommend dual antiplatelet therapy with aspirin and Plavix for possibility of plaque erosion.  Also recommend high intensity statin (tosuvastatin 20 mg) and beta blocker (metoprolol succinate 25 mg). Currently, not in clinical heart failure.  Reasonable to hold off guideline directed heart failure therapy.  Repeat echocardiogram in 3 months.  If EF remains  low, and if clinically indicated, could start on guideline directed heart failure therapy. Nigel Mormon, MD Pager: [phone] Office: [phone]   ECHOCARDIOGRAM COMPLETE  Result Date: 10/08/2020    ECHOCARDIOGRAM REPORT   Patient Name:   Mary Glass Date of Exam: 10/08/2020 Medical Rec #:  [id number]        Height:       65.5 in Accession #:    [id number]       Weight:       226.0 lb Date of Birth:  [birth year]         BSA:          2.095 m Patient Age:    64 years         BP:           137/85 mmHg Patient Gender: F                HR:           78 bpm. Exam Location:  Inpatient Procedure: 2D Echo Indications:    Chest Pain R07.9  History:        Patient has no prior history of Echocardiogram examinations.                 Risk Factors:Dyslipidemia.  Sonographer:    Mikki Santee RDCS (AE) Referring Phys: [id number] [place]  1. LAD infarct pattern. Left ventricular ejection fraction, by estimation, is 35 to 40%. The left ventricle has moderately decreased function. The left ventricle demonstrates regional wall motion abnormalities (see scoring diagram/findings for description). There is mild left ventricular hypertrophy. Left ventricular diastolic parameters are consistent with Grade I diastolic dysfunction (impaired relaxation).  2. Right ventricular systolic function is normal. The right ventricular size is normal.  3. The mitral valve is normal in structure. Mild mitral valve regurgitation. No evidence of mitral stenosis.  4. The aortic valve is normal in structure. Aortic valve regurgitation is not visualized. No aortic stenosis is present.  5. The inferior vena cava is normal in size with greater than 50% respiratory variability, suggesting right atrial pressure of 3 mmHg. FINDINGS  Left Ventricle: LAD infarct pattern. Left ventricular ejection fraction, by estimation, is 35 to 40%. The left ventricle has moderately decreased function. The left ventricle demonstrates  regional wall motion abnormalities. The left ventricular internal  cavity size was normal in size. There is mild left ventricular hypertrophy. Left ventricular diastolic parameters are consistent with Grade I diastolic dysfunction (impaired relaxation).  LV Wall Scoring: The mid and distal anterior septum and mid inferoseptal segment are akinetic. Right Ventricle: The right ventricular size is normal. No increase in  right ventricular wall thickness. Right ventricular systolic function is normal. Left Atrium: Left atrial size was normal in size. Right Atrium: Right atrial size was normal in size. Pericardium: There is no evidence of pericardial effusion. Mitral Valve: The mitral valve is normal in structure. Mild mitral valve regurgitation. No evidence of mitral valve stenosis. Tricuspid Valve: The tricuspid valve is normal in structure. Tricuspid valve regurgitation is not demonstrated. No evidence of tricuspid stenosis. Aortic Valve: The aortic valve is normal in structure. Aortic valve regurgitation is not visualized. No aortic stenosis is present. Pulmonic Valve: The pulmonic valve was normal in structure. Pulmonic valve regurgitation is not visualized. No evidence of pulmonic stenosis. Aorta: The aortic root is normal in size and structure. Venous: The inferior vena cava is normal in size with greater than 50% respiratory variability, suggesting right atrial pressure of 3 mmHg. IAS/Shunts: No atrial level shunt detected by color flow Doppler.  LEFT VENTRICLE PLAX 2D LVIDd:         5.25 cm LVIDs:         4.05 cm LV PW:         1.15 cm LV IVS:        1.20 cm LVOT diam:     2.20 cm LV SV:         73 LV SV Index:   35 LVOT Area:     3.80 cm  RIGHT VENTRICLE RV S prime:     9.46 cm/s TAPSE (M-mode): 0.9 cm LEFT ATRIUM             Index       RIGHT ATRIUM           Index LA diam:        3.45 cm 1.65 cm/m  RA Area:     11.50 cm LA Vol (A2C):   47.4 ml 22.62 ml/m RA Volume:   24.20 ml  11.55 ml/m LA Vol (A4C):    36.5 ml 17.42 ml/m LA Biplane Vol: 45.5 ml 21.71 ml/m  AORTIC VALVE LVOT Vmax:   92.50 cm/s LVOT Vmean:  62.267 cm/s LVOT VTI:    0.191 m  AORTA Ao Root diam: 3.10 cm  SHUNTS Systemic VTI:  0.19 m Systemic Diam: 2.20 cm Candee Furbish MD Electronically signed by Candee Furbish MD Signature Date/Time: 10/08/2020/10:43:11 AM    Final     Echocardiogram, left heart catheterization   Subjective: Patient was seen and examined at bedside.  Overnight events noted.  Patient reports chest pain has resolved.  Patient underwent left heart cath which shows plaque but didn't require stenting. Patient cleared from cardiology.  patient feels better and wants to be discharged home.  Discharge Exam: Vitals:   10/09/20 1120 10/09/20 1132  BP: (!) 99/33 (!) 105/36  Pulse: 77 80  Resp:    Temp:    SpO2: 96% 98%   Vitals:   10/09/20 1102 10/09/20 1119 10/09/20 1120 10/09/20 1132  BP: (!) 94/43 (!) 99/33 (!) 99/33 (!) 105/36  Pulse: 77 80 77 80  Resp:      Temp:      TempSrc:      SpO2: 96% 96% 96% 98%  Weight:      Height:        General: Pt is alert, awake, not in acute distress Cardiovascular: RRR, S1/S2 +, no rubs, no gallops Respiratory: CTA bilaterally, no wheezing, no rhonchi Abdominal: Soft, NT, ND, bowel sounds + Extremities: no edema, no cyanosis    The results  of significant diagnostics from this hospitalization (including imaging, microbiology, ancillary and laboratory) are listed below for reference.     Microbiology: Recent Results (from the past 240 hour(s))  Respiratory Panel by RT PCR (Flu A&B, Covid) - Nasopharyngeal Swab     Status: None   Collection Time: 10/07/20 10:46 PM   Specimen: Nasopharyngeal Swab  Result Value Ref Range Status   SARS Coronavirus 2 by RT PCR NEGATIVE NEGATIVE Final    Comment: (NOTE) SARS-CoV-2 target nucleic acids are NOT DETECTED.  The SARS-CoV-2 RNA is generally detectable in upper respiratoy specimens during the acute phase of infection. The  lowest concentration of SARS-CoV-2 viral copies this assay can detect is 131 copies/mL. A negative result does not preclude SARS-Cov-2 infection and should not be used as the sole basis for treatment or other patient management decisions. A negative result may occur with  improper specimen collection/handling, submission of specimen other than nasopharyngeal swab, presence of viral mutation(s) within the areas targeted by this assay, and inadequate number of viral copies (<131 copies/mL). A negative result must be combined with clinical observations, patient history, and epidemiological information. The expected result is Negative.  Fact Sheet for Patients:  PinkCheek.be  Fact Sheet for Healthcare Providers:  GravelBags.it  This test is no t yet approved or cleared by the [place] FDA and  has been authorized for detection and/or diagnosis of SARS-CoV-2 by FDA under an Emergency Use Authorization (EUA). This EUA will remain  in effect (meaning this test can be used) for the duration of the COVID-19 declaration under Section 564(b)(1) of the Act, 21 U.S.C. section 360bbb-3(b)(1), unless the authorization is terminated or revoked sooner.     Influenza A by PCR NEGATIVE NEGATIVE Final   Influenza B by PCR NEGATIVE NEGATIVE Final    Comment: (NOTE) The Xpert Xpress SARS-CoV-2/FLU/RSV assay is intended as an aid in  the diagnosis of influenza from Nasopharyngeal swab specimens and  should not be used as a sole basis for treatment. Nasal washings and  aspirates are unacceptable for Xpert Xpress SARS-CoV-2/FLU/RSV  testing.  Fact Sheet for Patients: PinkCheek.be  Fact Sheet for Healthcare Providers: GravelBags.it  This test is not yet approved or cleared by the [place] FDA and  has been authorized for detection and/or diagnosis of SARS-CoV-2 by  FDA under  an Emergency Use Authorization (EUA). This EUA will remain  in effect (meaning this test can be used) for the duration of the  Covid-19 declaration under Section 564(b)(1) of the Act, 21  U.S.C. section 360bbb-3(b)(1), unless the authorization is  terminated or revoked. Performed at [facility], [place] [address] [place], [place] [address]      Labs: BNP (last 3 results) No results for input(s): BNP in the last 8760 hours. Basic Metabolic Panel: Recent Labs  Lab 10/07/20 1841 10/08/20 0357  NA 138 135  K 4.0 3.7  CL 104 102  CO2 21* 23  GLUCOSE 101* 103*  BUN 14 13  CREATININE 1.01* 0.93  CALCIUM 9.2 8.8*   Liver Function Tests: No results for input(s): AST, ALT, ALKPHOS, BILITOT, PROT, ALBUMIN in the last 168 hours. No results for input(s): LIPASE, AMYLASE in the last 168 hours. No results for input(s): AMMONIA in the last 168 hours. CBC: Recent Labs  Lab 10/07/20 1841 10/09/20 0344  WBC 12.8* 10.1  HGB 13.1 12.3  HCT 42.2 39.9  MCV 83.9 83.8  PLT 370 353   Cardiac Enzymes: No results for input(s): CKTOTAL, CKMB,  CKMBINDEX, TROPONINI in the last 168 hours. BNP: Invalid input(s): POCBNP CBG: No results for input(s): GLUCAP in the last 168 hours. D-Dimer Recent Labs    10/08/20 0805  DDIMER <0.27   Hgb A1c No results for input(s): HGBA1C in the last 72 hours. Lipid Profile Recent Labs    10/08/20 0357  CHOL 168  HDL 45  LDLCALC 83  TRIG 202*  CHOLHDL 3.7   Thyroid function studies No results for input(s): TSH, T4TOTAL, T3FREE, THYROIDAB in the last 72 hours.  Invalid input(s): FREET3 Anemia work up No results for input(s): VITAMINB12, FOLATE, FERRITIN, TIBC, IRON, RETICCTPCT in the last 72 hours. Urinalysis No results found for: COLORURINE, APPEARANCEUR, [place], [place], [place], [place], [place], [place], PROTEINUR, UROBILINOGEN, NITRITE, LEUKOCYTESUR Sepsis Labs Invalid input(s): PROCALCITONIN,  WBC,   LACTICIDVEN Microbiology Recent Results (from the past 240 hour(s))  Respiratory Panel by RT PCR (Flu A&B, Covid) - Nasopharyngeal Swab     Status: None   Collection Time: 10/07/20 10:46 PM   Specimen: Nasopharyngeal Swab  Result Value Ref Range Status   SARS Coronavirus 2 by RT PCR NEGATIVE NEGATIVE Final    Comment: (NOTE) SARS-CoV-2 target nucleic acids are NOT DETECTED.  The SARS-CoV-2 RNA is generally detectable in upper respiratoy specimens during the acute phase of infection. The lowest concentration of SARS-CoV-2 viral copies this assay can detect is 131 copies/mL. A negative result does not preclude SARS-Cov-2 infection and should not be used as the sole basis for treatment or other patient management decisions. A negative result may occur with  improper specimen collection/handling, submission of specimen other than nasopharyngeal swab, presence of viral mutation(s) within the areas targeted by this assay, and inadequate number of viral copies (<131 copies/mL). A negative result must be combined with clinical observations, patient history, and epidemiological information. The expected result is Negative.  Fact Sheet for Patients:  PinkCheek.be  Fact Sheet for Healthcare Providers:  GravelBags.it  This test is no t yet approved or cleared by the [place] FDA and  has been authorized for detection and/or diagnosis of SARS-CoV-2 by FDA under an Emergency Use Authorization (EUA). This EUA will remain  in effect (meaning this test can be used) for the duration of the COVID-19 declaration under Section 564(b)(1) of the Act, 21 U.S.C. section 360bbb-3(b)(1), unless the authorization is terminated or revoked sooner.     Influenza A by PCR NEGATIVE NEGATIVE Final   Influenza B by PCR NEGATIVE NEGATIVE Final    Comment: (NOTE) The Xpert Xpress SARS-CoV-2/FLU/RSV assay is intended as an aid in  the diagnosis of  influenza from Nasopharyngeal swab specimens and  should not be used as a sole basis for treatment. Nasal washings and  aspirates are unacceptable for Xpert Xpress SARS-CoV-2/FLU/RSV  testing.  Fact Sheet for Patients: PinkCheek.be  Fact Sheet for Healthcare Providers: GravelBags.it  This test is not yet approved or cleared by the [place] FDA and  has been authorized for detection and/or diagnosis of SARS-CoV-2 by  FDA under an Emergency Use Authorization (EUA). This EUA will remain  in effect (meaning this test can be used) for the duration of the  Covid-19 declaration under Section 564(b)(1) of the Act, 21  U.S.C. section 360bbb-3(b)(1), unless the authorization is  terminated or revoked. Performed at [facility], [place] [address] [place], [place] [address]      Time coordinating discharge: Over 30 minutes  SIGNED:   Shawna Clamp, MD  [facility] 10/09/2020, 12:17 PM Pager  If 7PM-7AM, please contact night-coverage [email or web address]

## 2020-10-09 NOTE — Plan of Care (Signed)
DISCHARGE NOTE HOME Mary Glass to be discharged home per MD order. Discussed prescriptions and follow up appointments with the patient.  Medication list explained in detail. Patient verbalized understanding.  Instructed pt on minimal movement of RUE for next 24 hrs and then not lifting with RUE for one week.  Skin clean, dry and intact without evidence of skin break down, no evidence of skin tears noted.  TR band removed at 12:25, gauze and tegaderm applied, site still clean and intact without evidence of bleeding.  IV catheters discontinued intact. Site without signs and symptoms of complications. Dressing and pressure applied. Pt denies pain at the site currently. No complaints noted.  An After Visit Summary (AVS) was printed and given to the patient. Patient to be escorted via wheelchair, and discharged home via private auto.  Stephan Minister, RN

## 2020-10-09 NOTE — Interval H&P Note (Signed)
History and Physical Interval Note:  10/09/2020 7:37 AM  [name]  has presented today for surgery, with the diagnosis of unstable angina.  The various methods of treatment have been discussed with the patient and family. After consideration of risks, benefits and other options for treatment, the patient has consented to  Procedure(s): LEFT HEART CATH AND CORONARY ANGIOGRAPHY (N/A) as a surgical intervention.  The patient's history has been reviewed, patient examined, no change in status, stable for surgery.  I have reviewed the patient's chart and labs.  Questions were answered to the patient's satisfaction.    2016 Appropriate Use Criteria for Coronary Revascularization in Patients With Acute Coronary Syndrome NSTEMI/UA Intermediate Risk (TIMI Score 3-4) NSTEMI/Unstable angina, stabilized patient at Intermediate Risk (TIMI Score 3-4) Link Here: http://dodson-rose.net/ Indication:  Revascularization by PCI or CABG of 1 or more arteries in a patient with NSTEMI or unstable angina with Stabilization after presentation Intermediate risk for clinical events  A (7) Indication: 16; Score 7   [place]

## 2020-10-09 NOTE — Progress Notes (Signed)
ANTICOAGULATION CONSULT NOTE  Pharmacy Consult for heparin Indication: chest pain/ACS  Assessment: 64 yo lady to start heparin for CP.  She was not on anticoagulation PTA.  Hg 13.1, PTLC 370  Heparin level 0.47 this am.  No complications noted  Goal of Therapy:  Heparin level 0.3-0.7 units/ml Monitor platelets by anticoagulation protocol: Yes   Plan:  Continue heparin at 1600 units/hr  Check heparin level and CBC daily  Mary Glass, PharmD Clinical Pharmacist  10/09/2020 6:01 AM

## 2020-10-09 NOTE — Progress Notes (Signed)
Patient does not any endorse any chest pain overnight.

## 2020-10-09 NOTE — Discharge Instructions (Signed)
Advised to follow-up with primary care physician in 1 week. Advised to take dual antiplatelet medication( aspirin and Plavix) for next 6 months. Advised to follow-up with cardiology as scheduled.

## 2020-10-10 ENCOUNTER — Telehealth: Payer: Self-pay

## 2020-10-10 NOTE — Telephone Encounter (Signed)
lvm for pt to call back for [facility]

## 2020-10-10 NOTE — Telephone Encounter (Signed)
-----   Message from Nigel Mormon, MD sent at 10/09/2020  3:55 PM EST ----- Regarding: TOC @[facility] 's  Discharge follow up: TOC: needed Follow up appt: Scheduled Discharge diagnosis: Unstable angina Discharge date: 11/16  @[facility] , Just FYI. Can bill for TOC when you see her for f/u  Thanks MJP

## 2020-10-16 ENCOUNTER — Ambulatory Visit: Payer: Self-pay | Admitting: Cardiology

## 2020-10-29 ENCOUNTER — Encounter: Payer: Self-pay | Admitting: Cardiology

## 2020-10-29 ENCOUNTER — Other Ambulatory Visit: Payer: Self-pay

## 2020-10-29 ENCOUNTER — Ambulatory Visit: Payer: BC Managed Care – PPO | Admitting: Cardiology

## 2020-10-29 VITALS — BP 144/75 | HR 78 | Resp 16 | Ht 65.0 in | Wt 224.0 lb

## 2020-10-29 DIAGNOSIS — I209 Angina pectoris, unspecified: Secondary | ICD-10-CM

## 2020-10-29 DIAGNOSIS — R030 Elevated blood-pressure reading, without diagnosis of hypertension: Secondary | ICD-10-CM

## 2020-10-29 DIAGNOSIS — I200 Unstable angina: Secondary | ICD-10-CM

## 2020-10-29 DIAGNOSIS — I429 Cardiomyopathy, unspecified: Secondary | ICD-10-CM

## 2020-10-29 DIAGNOSIS — E781 Pure hyperglyceridemia: Secondary | ICD-10-CM

## 2020-10-29 DIAGNOSIS — I2510 Atherosclerotic heart disease of native coronary artery without angina pectoris: Secondary | ICD-10-CM

## 2020-10-29 DIAGNOSIS — E782 Mixed hyperlipidemia: Secondary | ICD-10-CM

## 2020-10-29 DIAGNOSIS — Z09 Encounter for follow-up examination after completed treatment for conditions other than malignant neoplasm: Secondary | ICD-10-CM

## 2020-10-29 MED ORDER — ISOSORBIDE MONONITRATE ER 30 MG PO TB24: 15.0000 mg | ORAL_TABLET | Freq: Every day | ORAL | 0 refills | Status: DC

## 2020-10-29 NOTE — Progress Notes (Signed)
Mary Glass Date of Birth: 07-Sep-1956 MRN: [id number] Primary Care Provider:Gordon, Cherylann Ratel, PA-C Primary Cardiologist: Rex Kras, DO, [facility] (established care 10/08/2020)  Date: 10/31/20 Last Visit: 10/08/2020 (hospital consult)  Chief Complaint  Patient presents with  . Chest Pain  . New Patient (Initial Visit)  . Hospitalization Follow-up    HPI  Mary Glass is a 64 y.o.  female who presents to the office with a chief complaint of "Posthospitalization follow-up, evaluation of chest pain, and management of CAD." Patient's past medical history and cardiovascular risk factors include: Hypertension, coronary artery disease, recurrent heart failure, cardiomyopathy, hyperlipidemia, obesity  Patient initially presented to the hospital with symptoms of chest pain concerning for unstable angina. Patient was started on IV heparin and ACS pathway.  EKG showed left bundle branch block and ST changes in the inferolateral ischemia. Echocardiogram reported 35-40% LVEF with regional wall motion normalities suggestive of LAD infarct. Subsequently underwent left heart catheterization as per report patient was noted to have a type I LAD vessel which does not reach the apex. There was noted to be 50% LAD stenosis without involvement of the adjacent diagonal branch. DFR was performed and noted to be physiological nonsignificant at 0.93. However, echocardiogram shows pattern suggestive of wrap around LAD infarct. 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  No stent were placed and recommended DAPT with aspirin and Plavix for possibility of plaque erosion. Also recommend high intensity statin and beta blocker (metoprolol succinate 25 mg).   Since last office visit patient states that she has had 2 episodes of chest pain which are brought on by effort related activities,  resolved with resting, and discomfort located over the left anterior chest wall, lasting for about 30 minutes, intensity 8 out of 10.  She has required 2 tablets of sublingual nitroglycerin tablets on two separate occasions.   FUNCTIONAL STATUS: No structured exercise program or daily routine. But is active on her feet during two working days.    ALLERGIES: Allergies  Allergen Reactions  . Hydrocodone-Acetaminophen     Other reaction(s): Other hyperactivity  . Sumatriptan     Other reaction(s): Other Head pain   . Ambien [Zolpidem Tartrate] Other (See Comments)    headaches  . Cheese Other (See Comments)    Causes migraine  . Codeine Other (See Comments)    hyperactivity  . Benadryl [Diphenhydramine] Rash    Topical only, makes rash worse  . Penicillins Nausea And Vomiting and Rash     MEDICATION LIST PRIOR TO VISIT: Current Outpatient Medications on File Prior to Visit  Medication Sig Dispense Refill  . amitriptyline (ELAVIL) 75 MG tablet Take 1 tablet (75 mg total) by mouth at bedtime. 90 tablet 3  . aspirin EC 81 MG EC tablet Take 1 tablet (81 mg total) by mouth daily. Swallow whole. 30 tablet 11  . b complex vitamins capsule Take 1 capsule by mouth daily.    Marland Kitchen buPROPion (WELLBUTRIN XL) 150 MG 24 hr tablet Take 1 tablet (150 mg total) by mouth daily. PATIENT NEEDS OFFICE VISIT FOR ADDITIONAL REFILLS 90 tablet 3  . Calcium Carb-Cholecalciferol (CALCIUM 500 + D) 500-125 MG-UNIT TABS Take 1 tablet by mouth daily.    . Cholecalciferol (VITAMIN D) 125 MCG (5000 UT) CAPS Take 1 tablet by mouth daily.    . clopidogrel (PLAVIX) 75 MG tablet Take 1 tablet (75 mg total) by mouth daily.  30 tablet 2  . fluticasone (FLONASE) 50 MCG/ACT nasal spray Place 2 sprays into both nostrils daily as needed for allergies or rhinitis.    . Glucosamine-Chondroitin (MOVE FREE PO) Take 1 tablet by mouth daily.    . hydrOXYzine (ATARAX/VISTARIL) 50 MG tablet Take 50 mg by mouth at bedtime.    Marland Kitchen KRILL OIL  PO Take 1 capsule by mouth daily.     Marland Kitchen MAGNESIUM PO Take 400 mg by mouth daily.    . metoprolol succinate (TOPROL-XL) 25 MG 24 hr tablet Take 1 tablet (25 mg total) by mouth daily. 30 tablet 2  . Multiple Vitamins-Minerals (OCUVITE ADULT 50+ PO) Take 1 tablet by mouth daily.    . nitroGLYCERIN (NITROSTAT) 0.4 MG SL tablet Place 1 tablet (0.4 mg total) under the tongue every 5 (five) minutes as needed for chest pain (Up to 3 doses in 15 minutes.). 30 tablet 2  . Nutritional Supplements (KETO PO) Take 2 tablets by mouth daily.    Marland Kitchen OVER THE COUNTER MEDICATION Take 1 tablet by mouth daily. Garden of Life Probiotic for Women    . PARoxetine (PAXIL) 30 MG tablet TAKE 1 TABLET (30 MG TOTAL) BY MOUTH DAILY. MUST SCHEDULE ANNUAL PHYSICAL FOR FURTHER REFILLS (Patient taking differently: Take 30 mg by mouth daily. ) 30 tablet 0  . RABEprazole (ACIPHEX) 20 MG tablet Take 20 mg by mouth 2 (two) times daily.    . rizatriptan (MAXALT) 10 MG tablet Take 10 mg by mouth as needed for migraine.     . rosuvastatin (CRESTOR) 10 MG tablet Take 1 tablet (10 mg total) by mouth at bedtime. 30 tablet 2  . TURMERIC PO Take 2,250 mg by mouth in the morning and at bedtime.     No current facility-administered medications on file prior to visit.    PAST MEDICAL HISTORY: Past Medical History:  Diagnosis Date  . Allergy   . Anxiety   . Arthritis   . Depression   . GERD (gastroesophageal reflux disease)   . Hyperlipidemia   . Migraine     PAST SURGICAL HISTORY: Past Surgical History:  Procedure Laterality Date  . ABDOMINAL HYSTERECTOMY  1995  . FOOT SURGERY    . INTRAVASCULAR PRESSURE WIRE/FFR STUDY N/A 10/09/2020   Procedure: INTRAVASCULAR PRESSURE WIRE/FFR STUDY;  Surgeon: Nigel Mormon, MD;  Location: [facility];  Service: Cardiovascular;  Laterality: N/A;  . LEFT HEART CATH AND CORONARY ANGIOGRAPHY N/A 10/09/2020   Procedure: LEFT HEART CATH AND CORONARY ANGIOGRAPHY;  Surgeon: Nigel Mormon, MD;  Location: [facility];  Service: Cardiovascular;  Laterality: N/A;    FAMILY HISTORY: The patient's family history includes Arthritis in her mother and paternal grandmother; Cancer in her maternal grandmother; Colon cancer (age of onset: 63) in her mother; Heart disease in her father and paternal grandmother; Hypertension in her brother; Parkinson's disease in her father.   SOCIAL HISTORY:  The patient  reports that she has never smoked. She has never used smokeless tobacco. She reports current alcohol use. She reports that she does not use drugs.  Review of Systems  Constitutional: Negative for chills and fever.  HENT: Negative for hoarse voice and nosebleeds.   Eyes: Negative for discharge, double vision and pain.  Cardiovascular: Negative for chest pain, claudication, dyspnea on exertion, leg swelling, near-syncope, orthopnea, palpitations, paroxysmal nocturnal dyspnea and syncope.  Respiratory: Negative for hemoptysis and shortness of breath.   Musculoskeletal: Negative for muscle cramps and myalgias.  Gastrointestinal: Negative for abdominal pain, constipation, diarrhea, hematemesis, hematochezia, melena, nausea and vomiting.  Neurological: Negative for dizziness and light-headedness.    PHYSICAL EXAM: Vitals with BMI 10/29/2020 10/09/2020 10/09/2020  Height 5\' 5"  - -  Weight 224 lbs - -  BMI 77.82 - -  Systolic 423 536 144  Diastolic 75 50 36  Pulse 78 69 80   CONSTITUTIONAL: Well-developed and well-nourished. No acute distress.  SKIN: Skin is warm and dry. No rash noted. No cyanosis. No pallor. No jaundice HEAD: Normocephalic and atraumatic.  EYES: No scleral icterus MOUTH/THROAT: Moist oral membranes.  NECK: No JVD present. No thyromegaly noted. No carotid bruits  LYMPHATIC: No visible cervical adenopathy.  CHEST Normal respiratory effort. No intercostal retractions  LUNGS: Clear to auscultation bilaterally. No stridor. No wheezes. No rales.   CARDIOVASCULAR: Regular rate and rhythm, positive S1-S2, no murmurs rubs or gallops appreciated. ABDOMINAL: Obese, soft, nontender, nondistended, positive bowel sounds all 4 quadrants. No apparent ascites.  EXTREMITIES: No peripheral edema  HEMATOLOGIC: No significant bruising NEUROLOGIC: Oriented to person, place, and time. Nonfocal. Normal muscle tone.  PSYCHIATRIC: Normal mood and affect. Normal behavior. Cooperative  CARDIAC DATABASE: EKG: 10/29/2020: Normal sinus rhythm, left bundle branch block, first-degree AV block, ST-T changes most likely secondary to LBBB.   Echocardiogram: 10/08/2020: 1. LAD infarct pattern. Left ventricular ejection fraction, by estimation, is 35 to 40%. The left ventricle has moderately decreased function. The left ventricle demonstrates regional wall motion abnormalities (see scoring diagram/findings for description). There is mild left ventricular hypertrophy. Left ventricular diastolic parameters are consistent with Grade I diastolic dysfunction (impaired relaxation). 2. Right ventricular systolic function is normal. The right ventricular size is normal. 3. The mitral valve is normal in structure. Mild mitral valve regurgitation. No evidence of mitral stenosis. 4. The aortic valve is normal in structure. Aortic valve regurgitation is not visualized. No aortic stenosis is present. 5. The inferior vena cava is normal in size with greater than 50% respiratory variability, suggesting right atrial pressure of 3 mmHg.  Stress Testing:  No results found for this or any previous visit from the past 1095 days.  Heart Catheterization: 10/09/2020  LM: Normal  LAD: Type 1 vessel that does not reach apex     Focal 50% LAD stenosis without involvement of adjacent diagonal branch  [place]: Normal  LCx: Normal  RCA: Large, super-dominant, supplies apex. Normal vessel   LABORATORY DATA: CBC Latest Ref Rng & Units 10/09/2020 10/07/2020 12/04/2014  WBC 4.0 - 10.5  K/uL 10.1 12.8(H) 9.9  Hemoglobin 12.0 - 15.0 g/dL 12.3 13.1 14.0  Hematocrit 36 - 46 % 39.9 42.2 43.1  Platelets 150 - 400 K/uL 353 370 304.0    CMP Latest Ref Rng & Units 10/08/2020 10/07/2020 12/04/2014  Glucose 70 - 99 mg/dL 103(H) 101(H) 101(H)  BUN 8 - 23 mg/dL 13 14 14   Creatinine 0.44 - 1.00 mg/dL 0.93 1.01(H) 0.9  Sodium 135 - 145 mmol/L 135 138 139  Potassium 3.5 - 5.1 mmol/L 3.7 4.0 4.0  Chloride 98 - 111 mmol/L 102 104 109  CO2 22 - 32 mmol/L 23 21(L) 24  Calcium 8.9 - 10.3 mg/dL 8.8(L) 9.2 8.9  Total Protein 6.0 - 8.3 g/dL - - 7.2  Total Bilirubin 0.2 - 1.2 mg/dL - - 0.3  Alkaline Phos 39 - 117 U/L - - 94  AST 0 - 37 U/L - - 22  ALT 0 - 35 U/L - - 23    Lipid Panel  Component Value Date/Time   CHOL 168 10/08/2020 0357   TRIG 202 (H) 10/08/2020 0357   HDL 45 10/08/2020 0357   CHOLHDL 3.7 10/08/2020 0357   VLDL 40 10/08/2020 0357   LDLCALC 83 10/08/2020 0357   LDLDIRECT 90.6 12/04/2014 1358    Lab Results  Component Value Date   HGBA1C 5.7 01/16/2014   No components found for: NTPROBNP Lab Results  Component Value Date   TSH 0.79 01/16/2014    Cardiac Panel (last 3 results) No results for input(s): CKTOTAL, CKMB, TROPONINIHS, RELINDX in the last 72 hours.  IMPRESSION:    ICD-10-CM   1. Nonobstructive atherosclerosis of coronary artery  I25.10 isosorbide mononitrate (IMDUR) 30 MG 24 hr tablet    PCV ECHOCARDIOGRAM COMPLETE  2. Angina pectoris (HCC)  I20.9 EKG 12-Lead    isosorbide mononitrate (IMDUR) 30 MG 24 hr tablet    PCV ECHOCARDIOGRAM COMPLETE  3. Cardiomyopathy, unspecified type ([facility])  I42.9 PCV ECHOCARDIOGRAM COMPLETE  4. Blood pressure elevated without history of HTN  R03.0   5. Hypertriglyceridemia  E78.1   6. Mixed hyperlipidemia  E78.2   7. Hospital discharge follow-up  Z09      RECOMMENDATIONS: Mary Glass is a 64 y.o. female whose past medical history and cardiovascular risk factors include: Hypertension, coronary artery  disease, recurrent heart failure, cardiomyopathy, hyperlipidemia, obesity.  Nonobstructive coronary disease with angina pectoris:  Patient continues to have episodes of angina pectoris but are less frequent compared to her hospitalization back in November 2021.  She has required the use of sublingual nitroglycerin tablets in the interim.  Continue aspirin and Plavix.  Continue statin therapy.  We will start Imdur 15 mg p.o. daily.  Medication profile discussed with the patient.  Continue Toprol-XL.  Patient's blood pressure is also not well controlled at today's office visit.  Patient is asked to keep a log of her blood pressure and to call the office if his systolic blood pressures are consistently 120 mmHg.  Cardiomyopathy:  Echocardiogram back in November 2021 noted LVEF of 35-40% and subsequent left heart catheterization noted nonobstructive CAD involving the LAD distribution.  Continue current medical therapy.  We will reevaluate LVEF in 3 months and if no significant improvement we will start her on guideline directed medical therapy for HFrEF.  For now continue dual antiplatelet therapy, statin, Toprol-XL, and Imdur.  Plan echocardiogram in February 2022.  And subsequent follow-up in March 2022.  Hyperlipidemia: Continue statin therapy.  Currently managed by primary care provider.  FINAL MEDICATION LIST END OF ENCOUNTER: Meds ordered this encounter  Medications  . isosorbide mononitrate (IMDUR) 30 MG 24 hr tablet    Sig: Take 0.5 tablets (15 mg total) by mouth daily.    Dispense:  45 tablet    Refill:  0    There are no discontinued medications.   Current Outpatient Medications:  .  amitriptyline (ELAVIL) 75 MG tablet, Take 1 tablet (75 mg total) by mouth at bedtime., Disp: 90 tablet, Rfl: 3 .  aspirin EC 81 MG EC tablet, Take 1 tablet (81 mg total) by mouth daily. Swallow whole., Disp: 30 tablet, Rfl: 11 .  b complex vitamins capsule, Take 1 capsule by mouth daily.,  Disp: , Rfl:  .  buPROPion (WELLBUTRIN XL) 150 MG 24 hr tablet, Take 1 tablet (150 mg total) by mouth daily. PATIENT NEEDS OFFICE VISIT FOR ADDITIONAL REFILLS, Disp: 90 tablet, Rfl: 3 .  Calcium Carb-Cholecalciferol (CALCIUM 500 + D) 500-125 MG-UNIT TABS, Take 1 tablet by  mouth daily., Disp: , Rfl:  .  Cholecalciferol (VITAMIN D) 125 MCG (5000 UT) CAPS, Take 1 tablet by mouth daily., Disp: , Rfl:  .  clopidogrel (PLAVIX) 75 MG tablet, Take 1 tablet (75 mg total) by mouth daily., Disp: 30 tablet, Rfl: 2 .  fluticasone (FLONASE) 50 MCG/ACT nasal spray, Place 2 sprays into both nostrils daily as needed for allergies or rhinitis., Disp: , Rfl:  .  Glucosamine-Chondroitin (MOVE FREE PO), Take 1 tablet by mouth daily., Disp: , Rfl:  .  hydrOXYzine (ATARAX/VISTARIL) 50 MG tablet, Take 50 mg by mouth at bedtime., Disp: , Rfl:  .  KRILL OIL PO, Take 1 capsule by mouth daily. , Disp: , Rfl:  .  MAGNESIUM PO, Take 400 mg by mouth daily., Disp: , Rfl:  .  metoprolol succinate (TOPROL-XL) 25 MG 24 hr tablet, Take 1 tablet (25 mg total) by mouth daily., Disp: 30 tablet, Rfl: 2 .  Multiple Vitamins-Minerals (OCUVITE ADULT 50+ PO), Take 1 tablet by mouth daily., Disp: , Rfl:  .  nitroGLYCERIN (NITROSTAT) 0.4 MG SL tablet, Place 1 tablet (0.4 mg total) under the tongue every 5 (five) minutes as needed for chest pain (Up to 3 doses in 15 minutes.)., Disp: 30 tablet, Rfl: 2 .  Nutritional Supplements (KETO PO), Take 2 tablets by mouth daily., Disp: , Rfl:  .  OVER THE COUNTER MEDICATION, Take 1 tablet by mouth daily. Garden of Life Probiotic for Women, Disp: , Rfl:  .  PARoxetine (PAXIL) 30 MG tablet, TAKE 1 TABLET (30 MG TOTAL) BY MOUTH DAILY. MUST SCHEDULE ANNUAL PHYSICAL FOR FURTHER REFILLS (Patient taking differently: Take 30 mg by mouth daily. ), Disp: 30 tablet, Rfl: 0 .  RABEprazole (ACIPHEX) 20 MG tablet, Take 20 mg by mouth 2 (two) times daily., Disp: , Rfl:  .  rizatriptan (MAXALT) 10 MG tablet, Take 10 mg  by mouth as needed for migraine. , Disp: , Rfl:  .  rosuvastatin (CRESTOR) 10 MG tablet, Take 1 tablet (10 mg total) by mouth at bedtime., Disp: 30 tablet, Rfl: 2 .  TURMERIC PO, Take 2,250 mg by mouth in the morning and at bedtime., Disp: , Rfl:  .  isosorbide mononitrate (IMDUR) 30 MG 24 hr tablet, Take 0.5 tablets (15 mg total) by mouth daily., Disp: 45 tablet, Rfl: 0  Orders Placed This Encounter  Procedures  . EKG 12-Lead  . PCV ECHOCARDIOGRAM COMPLETE   --Continue cardiac medications as reconciled in final medication list. --Return in about 3 months (around 01/27/2021) for Reevaluation of chest pain and cardiomyopathy. . Or sooner if needed. --Continue follow-up with your primary care physician regarding the management of your other chronic comorbid conditions.  Patient's questions and concerns were addressed to her satisfaction. She voices understanding of the instructions provided during this encounter.   This note was created using a voice recognition software as a result there may be grammatical errors inadvertently enclosed that do not reflect the nature of this encounter. Every attempt is made to correct such errors.  Time spent: 45 minutes reviewing hospitalization records, echocardiogram and left heart catheterization reports, discussing disease management, coordination of care, ordering prescription and additional diagnostic tests.  Rex Kras, [place], [facility]  Pager: [phone] Office: [phone]

## 2020-12-14 ENCOUNTER — Telehealth: Payer: Self-pay | Admitting: Cardiology

## 2020-12-14 NOTE — Telephone Encounter (Signed)
Patient's husband called stating that his wife has been having chest discomfort.  However, he did not have any additional details and therefore I called the patient to understand the symptoms.  Patient states that she was diagnosed with COVID-19 approximately 2 weeks ago and over the last couple days has been having chest discomfort.  She states that she has been feeling tired and fatigued and therefore yesterday she forgot to take her morning medications and that is when the chest discomfort started.  After taking her morning medications later in the afternoon the discomfort improved and did not think much of it.  However, it is has surfaced again.  Located over the left anterior chest wall, 5 out of 10 in intensity, worse with effort related activity and does resolve with resting.  Patient states that she did take her morning medications today.  She has not required sublingual nitroglycerin tablets today.  She did use 1 tablet yesterday with resolution of pain.  I have asked her to take her medications as prescribed. Increase Imdur to 30 mg p.o. daily. And to use sublingual nitroglycerin tablets on as needed basis.  She is encouraged to go to the closest ER via EMS if she is requiring more than 2 sublingual nitroglycerin tablets 5 minutes apart.  Of note, I did review the left heart catheterization report from November 2021.  Patient verbalizes understanding.  Rex Kras, [place], [facility]  Pager: [phone] Office: [phone]

## 2020-12-18 ENCOUNTER — Other Ambulatory Visit: Payer: Self-pay

## 2020-12-18 ENCOUNTER — Ambulatory Visit: Payer: BC Managed Care – PPO

## 2020-12-18 DIAGNOSIS — I209 Angina pectoris, unspecified: Secondary | ICD-10-CM

## 2020-12-18 DIAGNOSIS — I2510 Atherosclerotic heart disease of native coronary artery without angina pectoris: Secondary | ICD-10-CM

## 2020-12-18 DIAGNOSIS — I429 Cardiomyopathy, unspecified: Secondary | ICD-10-CM

## 2021-01-06 ENCOUNTER — Other Ambulatory Visit: Payer: Self-pay | Admitting: Cardiology

## 2021-01-07 NOTE — Progress Notes (Signed)
Spoke to patient she is aware

## 2021-01-08 ENCOUNTER — Other Ambulatory Visit: Payer: Self-pay | Admitting: Cardiology

## 2021-01-08 DIAGNOSIS — I2510 Atherosclerotic heart disease of native coronary artery without angina pectoris: Secondary | ICD-10-CM

## 2021-01-08 DIAGNOSIS — I209 Angina pectoris, unspecified: Secondary | ICD-10-CM

## 2021-01-28 ENCOUNTER — Ambulatory Visit: Payer: BC Managed Care – PPO | Admitting: Cardiology

## 2021-02-15 ENCOUNTER — Ambulatory Visit: Payer: BC Managed Care – PPO | Admitting: Cardiology

## 2021-02-27 NOTE — Progress Notes (Signed)
[name] Date of Birth: 1956/07/21 MRN: [id number] Primary Care Provider:Gordon, Cherylann Ratel, PA-C Primary Cardiologist: Rex Kras, DO, [facility]  (established care 10/08/2020)  Date: 02/27/2021 Last Office Visit: 10/29/2020  Chief Complaint  Patient presents with  . Chest Pain  . Follow-up    HPI  Allice Garro is a 65 y.o.  female who presents to the office with a chief complaint of " reevaluation of chest pain." Patient's past medical history and cardiovascular risk factors include: Hypertension, coronary artery disease, recurrent heart failure, ischemic cardiomyopathy, hyperlipidemia, obesity.  Patient initially presented to the hospital with symptoms of chest pain concerning for unstable angina. Patient was started on IV heparin and ACS pathway.  EKG showed left bundle branch block and ST changes in the inferolateral ischemia. Echocardiogram reported 35-40% LVEF with regional wall motion normalities suggestive of LAD infarct. Subsequently underwent left heart catheterization as per report patient was noted to have a type I LAD vessel which does not reach the apex. There was noted to be 50% LAD stenosis without involvement of the adjacent diagonal branch. DFR was performed and noted to be physiological nonsignificant at 0.93. However, echocardiogram shows pattern suggestive of wrap around LAD infarct. 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  No stent were placed and recommended DAPT for possibility of plaque erosion and high intensity statin and beta blocker.  Since last office visit patient reached out in January 2022 as she was having chest discomfort.  At that time the shared decision was to increase her Imdur to 30 mg p.o. daily.  Patient states that since then her symptoms of chest pain have significantly improved and sparingly does it occur.  If  it does happen the symptoms are short-lived and does not require any sublingual nitroglycerin tablets.  Patient is able to do her activities of daily living without any chest pain or anginal symptoms.  Patient currently denies chest pain or heart failure symptoms.  But continues to have left related dyspnea which is chronic and stable.  FUNCTIONAL STATUS: No structured exercise program or daily routine. But is active on her feet during two working days.    ALLERGIES: Allergies  Allergen Reactions  . Hydrocodone-Acetaminophen     Other reaction(s): Other hyperactivity  . Sumatriptan     Other reaction(s): Other Head pain   . Ambien [Zolpidem Tartrate] Other (See Comments)    headaches  . Cheese Other (See Comments)    Causes migraine  . Codeine Other (See Comments)    hyperactivity  . Benadryl [Diphenhydramine] Rash    Topical only, makes rash worse  . Penicillins Nausea And Vomiting and Rash     MEDICATION LIST PRIOR TO VISIT: Current Outpatient Medications on File Prior to Visit  Medication Sig Dispense Refill  . amitriptyline (ELAVIL) 75 MG tablet Take 1 tablet (75 mg total) by mouth at bedtime. 90 tablet 3  . aspirin EC 81 MG EC tablet Take 1 tablet (81 mg total) by mouth daily. Swallow whole. 30 tablet 11  . b complex vitamins capsule Take 1 capsule by mouth daily.    Marland Kitchen buPROPion (WELLBUTRIN XL) 150 MG 24 hr tablet Take 1 tablet (150 mg total) by mouth daily. PATIENT NEEDS OFFICE VISIT FOR ADDITIONAL REFILLS 90 tablet 3  . Calcium Carb-Cholecalciferol 500-125 MG-UNIT TABS Take 1 tablet by mouth daily.    . Cholecalciferol (VITAMIN D) 125 MCG (5000 UT)  CAPS Take 1 tablet by mouth daily.    . clopidogrel (PLAVIX) 75 MG tablet TAKE ONE TABLET BY MOUTH DAILY 30 tablet 2  . fluticasone (FLONASE) 50 MCG/ACT nasal spray Place 2 sprays into both nostrils daily as needed for allergies or rhinitis.    . Glucosamine-Chondroitin (MOVE FREE PO) Take 1 tablet by mouth daily.    .  isosorbide mononitrate (IMDUR) 30 MG 24 hr tablet TAKE 1/2 TABLET BY MOUTH DAILY (Patient taking differently: Take 30 mg by mouth daily.) 45 tablet 0  . KRILL OIL PO Take 1 capsule by mouth daily.     Marland Kitchen MAGNESIUM PO Take 400 mg by mouth daily.    . metoprolol succinate (TOPROL-XL) 25 MG 24 hr tablet TAKE ONE TABLET BY MOUTH DAILY 30 tablet 2  . montelukast (SINGULAIR) 10 MG tablet Take 10 mg by mouth at bedtime.    . Multiple Vitamins-Minerals (OCUVITE ADULT 50+ PO) Take 1 tablet by mouth daily.    . nitroGLYCERIN (NITROSTAT) 0.4 MG SL tablet Place 1 tablet (0.4 mg total) under the tongue every 5 (five) minutes as needed for chest pain (Up to 3 doses in 15 minutes.). 30 tablet 2  . NON FORMULARY Take by mouth daily. Morning Complete powder    . Nutritional Supplements (KETO PO) Take 2 tablets by mouth daily.    Marland Kitchen OVER THE COUNTER MEDICATION Take 1 tablet by mouth daily. Garden of Life Probiotic for Women    . PARoxetine (PAXIL) 30 MG tablet TAKE 1 TABLET (30 MG TOTAL) BY MOUTH DAILY. MUST SCHEDULE ANNUAL PHYSICAL FOR FURTHER REFILLS (Patient taking differently: Take 30 mg by mouth daily.) 30 tablet 0  . RABEprazole (ACIPHEX) 20 MG tablet Take 20 mg by mouth 2 (two) times daily.    . Red Yeast Rice Extract POWD Take 1 g by mouth daily.    . rizatriptan (MAXALT) 10 MG tablet Take 10 mg by mouth as needed for migraine.     . rosuvastatin (CRESTOR) 10 MG tablet Take 1 tablet (10 mg total) by mouth at bedtime. 30 tablet 2  . TURMERIC PO Take 2,250 mg by mouth in the morning and at bedtime.     No current facility-administered medications on file prior to visit.    PAST MEDICAL HISTORY: Past Medical History:  Diagnosis Date  . Allergy   . Anxiety   . Arthritis   . Depression   . GERD (gastroesophageal reflux disease)   . Hyperlipidemia   . Migraine     PAST SURGICAL HISTORY: Past Surgical History:  Procedure Laterality Date  . ABDOMINAL HYSTERECTOMY  1995  . FOOT SURGERY    .  INTRAVASCULAR PRESSURE WIRE/FFR STUDY N/A 10/09/2020   Procedure: INTRAVASCULAR PRESSURE WIRE/FFR STUDY;  Surgeon: Nigel Mormon, MD;  Location: [place] [facility];  Service: Cardiovascular;  Laterality: N/A;  . LEFT HEART CATH AND CORONARY ANGIOGRAPHY N/A 10/09/2020   Procedure: LEFT HEART CATH AND CORONARY ANGIOGRAPHY;  Surgeon: Nigel Mormon, MD;  Location: [place] [facility];  Service: Cardiovascular;  Laterality: N/A;    FAMILY HISTORY: The patient's family history includes Arthritis in her mother and paternal grandmother; Cancer in her maternal grandmother; Colon cancer (age of onset: 37) in her mother; Heart disease in her father and paternal grandmother; Hypertension in her brother; Parkinson's disease in her father.   SOCIAL HISTORY:  The patient  reports that she has never smoked. She has never used smokeless tobacco. She reports current alcohol use. She reports  that she does not use drugs.  Review of Systems  Constitutional: Positive for weight gain. Negative for chills and fever.  HENT: Negative for hoarse voice and nosebleeds.   Eyes: Negative for discharge, double vision and pain.  Cardiovascular: Positive for dyspnea on exertion (Chronic and stable). Negative for chest pain, claudication, leg swelling, near-syncope, orthopnea, palpitations, paroxysmal nocturnal dyspnea and syncope.  Respiratory: Negative for hemoptysis and shortness of breath.   Musculoskeletal: Negative for muscle cramps and myalgias.  Gastrointestinal: Negative for abdominal pain, constipation, diarrhea, hematemesis, hematochezia, melena, nausea and vomiting.  Neurological: Negative for dizziness and light-headedness.    PHYSICAL EXAM: Vitals with BMI 02/28/2021 10/29/2020 10/09/2020  Height 5\' 5"  5\' 5"  -  Weight 229 lbs 224 lbs -  BMI 62.69 48.54 -  Systolic 627 035 009  Diastolic 82 75 50  Pulse 72 78 69   CONSTITUTIONAL: Well-developed and well-nourished. No acute distress.  SKIN: Skin  is warm and dry. No rash noted. No cyanosis. No pallor. No jaundice HEAD: Normocephalic and atraumatic.  EYES: No scleral icterus MOUTH/THROAT: Moist oral membranes.  NECK: No JVD present. No thyromegaly noted. No carotid bruits  LYMPHATIC: No visible cervical adenopathy.  CHEST Normal respiratory effort. No intercostal retractions  LUNGS: Clear to auscultation bilaterally. No stridor. No wheezes. No rales.  CARDIOVASCULAR: Regular rate and rhythm, positive S1-S2, no murmurs rubs or gallops appreciated. ABDOMINAL: Obese, soft, nontender, nondistended, positive bowel sounds all 4 quadrants. No apparent ascites.  EXTREMITIES: No peripheral edema  HEMATOLOGIC: No significant bruising NEUROLOGIC: Oriented to person, place, and time. Nonfocal. Normal muscle tone.  PSYCHIATRIC: Normal mood and affect. Normal behavior. Cooperative  CARDIAC DATABASE: EKG: 10/29/2020: Normal sinus rhythm, left bundle branch block, first-degree AV block, ST-T changes most likely secondary to LBBB.   Echocardiogram: 12/18/2020: Mildly depressed LV systolic function with visual EF 40-45%. Left ventricle cavity is normal in size. Abnormal septal wall motion due to left bundle branch block. Doppler evidence of grade I (impaired) diastolic dysfunction, normal LAP. Left ventricle regional wall motion findings: Mid anteroseptal, Mid inferoseptal and Apical septal hypokinesis. Trace aortic regurgitation. Mild (Grade I) mitral regurgitation. Compared to prior study dated 10/08/2020 LVEF improved from 35-40% to 40-45% otherwise no significant change.  Stress Testing:  No results found for this or any previous visit from the past 1095 days.  Heart Catheterization: 10/09/2020  LM: Normal  LAD: Type 1 vessel that does not reach apex     Focal 50% LAD stenosis without involvement of adjacent diagonal branch  [place]: Normal  LCx: Normal  RCA: Large, super-dominant, supplies apex. Normal vessel   LABORATORY DATA: CBC  Latest Ref Rng & Units 10/09/2020 10/07/2020 12/04/2014  WBC 4.0 - 10.5 K/uL 10.1 12.8(H) 9.9  Hemoglobin 12.0 - 15.0 g/dL 12.3 13.1 14.0  Hematocrit 36.0 - 46.0 % 39.9 42.2 43.1  Platelets 150 - 400 K/uL 353 370 304.0    CMP Latest Ref Rng & Units 10/08/2020 10/07/2020 12/04/2014  Glucose 70 - 99 mg/dL 103(H) 101(H) 101(H)  BUN 8 - 23 mg/dL 13 14 14   Creatinine 0.44 - 1.00 mg/dL 0.93 1.01(H) 0.9  Sodium 135 - 145 mmol/L 135 138 139  Potassium 3.5 - 5.1 mmol/L 3.7 4.0 4.0  Chloride 98 - 111 mmol/L 102 104 109  CO2 22 - 32 mmol/L 23 21(L) 24  Calcium 8.9 - 10.3 mg/dL 8.8(L) 9.2 8.9  Total Protein 6.0 - 8.3 g/dL - - 7.2  Total Bilirubin 0.2 - 1.2 mg/dL - - 0.3  Alkaline Phos 39 - 117 U/L - - 94  AST 0 - 37 U/L - - 22  ALT 0 - 35 U/L - - 23    Lipid Panel     Component Value Date/Time   CHOL 168 10/08/2020 0357   TRIG 202 (H) 10/08/2020 0357   HDL 45 10/08/2020 0357   CHOLHDL 3.7 10/08/2020 0357   VLDL 40 10/08/2020 0357   LDLCALC 83 10/08/2020 0357   LDLDIRECT 90.6 12/04/2014 1358    Lab Results  Component Value Date   HGBA1C 5.7 01/16/2014   No components found for: NTPROBNP Lab Results  Component Value Date   TSH 0.79 01/16/2014    Cardiac Panel (last 3 results) No results for input(s): CKTOTAL, CKMB, TROPONINIHS, RELINDX in the last 72 hours.  IMPRESSION:    ICD-10-CM   1. Nonobstructive atherosclerosis of coronary artery  I25.10   2. Ischemic cardiomyopathy  I25.5 sacubitril-valsartan (ENTRESTO) 24-26 MG    Basic metabolic panel    Magnesium    Magnesium    Basic metabolic panel  3. Benign hypertension  I10   4. Hypertriglyceridemia  E78.1   5. Mixed hyperlipidemia  E78.2      RECOMMENDATIONS: Orvetta Danielski is a 65 y.o. female whose past medical history and cardiovascular risk factors include: Hypertension, coronary artery disease, recurrent heart failure, ischemic cardiomyopathy, hyperlipidemia, obesity.  Nonobstructive coronary disease without  angina pectoris:  Symptoms of chest pain have improved significantly since the up titration of Imdur.    Recent echocardiogram notes reduced LVEF, with regional wall motion abnormalities and mild valvular heart disease.    No recent use of sublingual nitroglycerin tablets.  Continue aspirin and Plavix.  Continue statin therapy.  Continue Imdur 30 mg p.o. daily   Continue Toprol-XL.  Ischemic cardiomyopathy:  Since last visit patient had an echocardiogram which notes mild improvement in LVEF but continues to have findings suggestive of ischemic cardiomyopathy with reduced LVEF and regional wall motion abnormalities.  Medications reconciled.  We will start Entresto 24/26 mg p.o. daily.  Blood work in 1 week to evaluate kidney function and electrolytes.    Hyperlipidemia: Continue statin therapy.  Currently managed by primary care provider.  Recommend LDL less than 70 mg/dL.  FINAL MEDICATION LIST END OF ENCOUNTER: Meds ordered this encounter  Medications  . sacubitril-valsartan (ENTRESTO) 24-26 MG    Sig: Take 1 tablet by mouth 2 (two) times daily.    Dispense:  60 tablet    Refill:  0    Medications Discontinued During This Encounter  Medication Reason  . hydrOXYzine (ATARAX/VISTARIL) 50 MG tablet Completed Course     Current Outpatient Medications:  .  amitriptyline (ELAVIL) 75 MG tablet, Take 1 tablet (75 mg total) by mouth at bedtime., Disp: 90 tablet, Rfl: 3 .  aspirin EC 81 MG EC tablet, Take 1 tablet (81 mg total) by mouth daily. Swallow whole., Disp: 30 tablet, Rfl: 11 .  b complex vitamins capsule, Take 1 capsule by mouth daily., Disp: , Rfl:  .  buPROPion (WELLBUTRIN XL) 150 MG 24 hr tablet, Take 1 tablet (150 mg total) by mouth daily. PATIENT NEEDS OFFICE VISIT FOR ADDITIONAL REFILLS, Disp: 90 tablet, Rfl: 3 .  Calcium Carb-Cholecalciferol 500-125 MG-UNIT TABS, Take 1 tablet by mouth daily., Disp: , Rfl:  .  Cholecalciferol (VITAMIN D) 125 MCG (5000 UT) CAPS, Take  1 tablet by mouth daily., Disp: , Rfl:  .  clopidogrel (PLAVIX) 75 MG tablet, TAKE ONE TABLET BY MOUTH DAILY,  Disp: 30 tablet, Rfl: 2 .  fluticasone (FLONASE) 50 MCG/ACT nasal spray, Place 2 sprays into both nostrils daily as needed for allergies or rhinitis., Disp: , Rfl:  .  Glucosamine-Chondroitin (MOVE FREE PO), Take 1 tablet by mouth daily., Disp: , Rfl:  .  isosorbide mononitrate (IMDUR) 30 MG 24 hr tablet, TAKE 1/2 TABLET BY MOUTH DAILY (Patient taking differently: Take 30 mg by mouth daily.), Disp: 45 tablet, Rfl: 0 .  KRILL OIL PO, Take 1 capsule by mouth daily. , Disp: , Rfl:  .  MAGNESIUM PO, Take 400 mg by mouth daily., Disp: , Rfl:  .  metoprolol succinate (TOPROL-XL) 25 MG 24 hr tablet, TAKE ONE TABLET BY MOUTH DAILY, Disp: 30 tablet, Rfl: 2 .  montelukast (SINGULAIR) 10 MG tablet, Take 10 mg by mouth at bedtime., Disp: , Rfl:  .  Multiple Vitamins-Minerals (OCUVITE ADULT 50+ PO), Take 1 tablet by mouth daily., Disp: , Rfl:  .  nitroGLYCERIN (NITROSTAT) 0.4 MG SL tablet, Place 1 tablet (0.4 mg total) under the tongue every 5 (five) minutes as needed for chest pain (Up to 3 doses in 15 minutes.)., Disp: 30 tablet, Rfl: 2 .  NON FORMULARY, Take by mouth daily. Morning Complete powder, Disp: , Rfl:  .  Nutritional Supplements (KETO PO), Take 2 tablets by mouth daily., Disp: , Rfl:  .  OVER THE COUNTER MEDICATION, Take 1 tablet by mouth daily. Garden of Life Probiotic for Women, Disp: , Rfl:  .  PARoxetine (PAXIL) 30 MG tablet, TAKE 1 TABLET (30 MG TOTAL) BY MOUTH DAILY. MUST SCHEDULE ANNUAL PHYSICAL FOR FURTHER REFILLS (Patient taking differently: Take 30 mg by mouth daily.), Disp: 30 tablet, Rfl: 0 .  RABEprazole (ACIPHEX) 20 MG tablet, Take 20 mg by mouth 2 (two) times daily., Disp: , Rfl:  .  Red Yeast Rice Extract POWD, Take 1 g by mouth daily., Disp: , Rfl:  .  rizatriptan (MAXALT) 10 MG tablet, Take 10 mg by mouth as needed for migraine. , Disp: , Rfl:  .  rosuvastatin (CRESTOR) 10  MG tablet, Take 1 tablet (10 mg total) by mouth at bedtime., Disp: 30 tablet, Rfl: 2 .  sacubitril-valsartan (ENTRESTO) 24-26 MG, Take 1 tablet by mouth 2 (two) times daily., Disp: 60 tablet, Rfl: 0 .  TURMERIC PO, Take 2,250 mg by mouth in the morning and at bedtime., Disp: , Rfl:   Orders Placed This Encounter  Procedures  . Basic metabolic panel  . Magnesium   --Continue cardiac medications as reconciled in final medication list. --Return in about 6 months (around 08/30/2021) for Follow up cardiomyopathy, nonobstructive CAD. Or sooner if needed. --Continue follow-up with your primary care physician regarding the management of your other chronic comorbid conditions.  Patient's questions and concerns were addressed to her satisfaction. She voices understanding of the instructions provided during this encounter.   This note was created using a voice recognition software as a result there may be grammatical errors inadvertently enclosed that do not reflect the nature of this encounter. Every attempt is made to correct such errors.  Rex Kras, [place], [facility]  Pager: [phone] Office: [phone]

## 2021-02-28 ENCOUNTER — Encounter: Payer: Self-pay | Admitting: Cardiology

## 2021-02-28 ENCOUNTER — Ambulatory Visit: Payer: BC Managed Care – PPO | Admitting: Cardiology

## 2021-02-28 ENCOUNTER — Other Ambulatory Visit: Payer: Self-pay

## 2021-02-28 VITALS — BP 128/82 | HR 72 | Temp 97.6°F | Resp 16 | Ht 65.0 in | Wt 229.0 lb

## 2021-02-28 DIAGNOSIS — I255 Ischemic cardiomyopathy: Secondary | ICD-10-CM

## 2021-02-28 DIAGNOSIS — I10 Essential (primary) hypertension: Secondary | ICD-10-CM

## 2021-02-28 DIAGNOSIS — E782 Mixed hyperlipidemia: Secondary | ICD-10-CM

## 2021-02-28 DIAGNOSIS — I209 Angina pectoris, unspecified: Secondary | ICD-10-CM

## 2021-02-28 DIAGNOSIS — I429 Cardiomyopathy, unspecified: Secondary | ICD-10-CM

## 2021-02-28 DIAGNOSIS — I2510 Atherosclerotic heart disease of native coronary artery without angina pectoris: Secondary | ICD-10-CM

## 2021-02-28 DIAGNOSIS — Z09 Encounter for follow-up examination after completed treatment for conditions other than malignant neoplasm: Secondary | ICD-10-CM

## 2021-02-28 DIAGNOSIS — R030 Elevated blood-pressure reading, without diagnosis of hypertension: Secondary | ICD-10-CM

## 2021-02-28 DIAGNOSIS — E781 Pure hyperglyceridemia: Secondary | ICD-10-CM

## 2021-02-28 MED ORDER — ENTRESTO 24-26 MG PO TABS: 1.0000 | ORAL_TABLET | Freq: Two times a day (BID) | ORAL | 0 refills | Status: DC

## 2021-03-08 ENCOUNTER — Other Ambulatory Visit: Payer: Self-pay | Admitting: Cardiology

## 2021-03-08 DIAGNOSIS — I209 Angina pectoris, unspecified: Secondary | ICD-10-CM

## 2021-03-08 DIAGNOSIS — I2510 Atherosclerotic heart disease of native coronary artery without angina pectoris: Secondary | ICD-10-CM

## 2021-03-14 ENCOUNTER — Telehealth: Payer: Self-pay

## 2021-03-14 NOTE — Telephone Encounter (Signed)
Pt called and stated that you had changed her isosorbide from half a tablet to a whole, but when she picked up her prescription, it still says half a tablet. How should she be taking this?

## 2021-03-14 NOTE — Telephone Encounter (Signed)
Continue Imdur 30mg  po qday. Please update the MAR.

## 2021-03-15 LAB — BASIC METABOLIC PANEL
BUN/Creatinine Ratio: 13 (ref 12–28)
BUN: 11 mg/dL (ref 8–27)
CO2: 23 mmol/L (ref 20–29)
Calcium: 9.3 mg/dL (ref 8.7–10.3)
Chloride: 102 mmol/L (ref 96–106)
Creatinine, Ser: 0.84 mg/dL (ref 0.57–1.00)
Glucose: 95 mg/dL (ref 65–99)
Potassium: 4.3 mmol/L (ref 3.5–5.2)
Sodium: 142 mmol/L (ref 134–144)
eGFR: 78 mL/min/{1.73_m2} (ref >59)

## 2021-03-15 LAB — MAGNESIUM: Magnesium: 2.4 mg/dL — ABNORMAL HIGH (ref 1.6–2.3)

## 2021-03-20 MED ORDER — ISOSORBIDE MONONITRATE ER 30 MG PO TB24: 30.0000 mg | ORAL_TABLET | Freq: Every day | ORAL | 3 refills | Status: DC

## 2021-03-20 NOTE — Telephone Encounter (Signed)
Attempted to call pt, no answer. Left vm requesting call back.

## 2021-03-20 NOTE — Telephone Encounter (Signed)
Called and spoke to pt regarding medication. Pt voiced understanding.

## 2021-03-27 ENCOUNTER — Other Ambulatory Visit: Payer: Self-pay | Admitting: Cardiology

## 2021-03-27 DIAGNOSIS — I255 Ischemic cardiomyopathy: Secondary | ICD-10-CM

## 2021-04-03 ENCOUNTER — Other Ambulatory Visit: Payer: Self-pay | Admitting: Cardiology

## 2021-05-03 ENCOUNTER — Other Ambulatory Visit: Payer: Self-pay | Admitting: Cardiology

## 2021-05-07 ENCOUNTER — Other Ambulatory Visit: Payer: Self-pay | Admitting: Cardiology

## 2021-05-07 DIAGNOSIS — I255 Ischemic cardiomyopathy: Secondary | ICD-10-CM

## 2021-05-09 ENCOUNTER — Other Ambulatory Visit: Payer: Self-pay

## 2021-06-03 ENCOUNTER — Other Ambulatory Visit: Payer: Self-pay | Admitting: Cardiology

## 2021-06-03 DIAGNOSIS — I255 Ischemic cardiomyopathy: Secondary | ICD-10-CM

## 2021-08-30 ENCOUNTER — Other Ambulatory Visit: Payer: Self-pay

## 2021-08-30 ENCOUNTER — Encounter: Payer: Self-pay | Admitting: Cardiology

## 2021-08-30 ENCOUNTER — Ambulatory Visit: Payer: BLUE CROSS/BLUE SHIELD | Admitting: Cardiology

## 2021-08-30 VITALS — BP 131/71 | HR 81 | Temp 97.8°F | Resp 17 | Ht 65.0 in | Wt 215.6 lb

## 2021-08-30 DIAGNOSIS — I10 Essential (primary) hypertension: Secondary | ICD-10-CM

## 2021-08-30 DIAGNOSIS — E782 Mixed hyperlipidemia: Secondary | ICD-10-CM

## 2021-08-30 DIAGNOSIS — I2510 Atherosclerotic heart disease of native coronary artery without angina pectoris: Secondary | ICD-10-CM

## 2021-08-30 DIAGNOSIS — I255 Ischemic cardiomyopathy: Secondary | ICD-10-CM

## 2021-08-30 MED ORDER — ISOSORBIDE MONONITRATE ER 30 MG PO TB24: 30.0000 mg | ORAL_TABLET | Freq: Every day | ORAL | 1 refills | Status: DC

## 2021-08-30 NOTE — Progress Notes (Signed)
Mary Glass Date of Birth: 1956/10/03 MRN: [id number] Primary Care Provider:Gordon, Cherylann Ratel, PA-C Primary Cardiologist: Rex Kras, DO, [facility]  (established care 10/08/2020)  Date: 08/30/21 Last Office Visit: 02/28/2021   Chief Complaint  Patient presents with   Follow-up    6 MONTH   Cardiomyopathy   Coronary Artery Disease    HPI  Mary Glass is a 65 y.o.  female who presents to the office with a chief complaint of " 4-month follow-up for CAD and cardiomyopathy management." Patient's past medical history and cardiovascular risk factors include: Hypertension, coronary artery disease, chronic HFrEF, ischemic cardiomyopathy, hyperlipidemia, obesity.  Patient initially presented to the hospital with symptoms of chest pain concerning for unstable angina. Patient was started on IV heparin and ACS pathway.  Patient was initially seen in the hospital in consultation back in November 2021 for chest pain and unstable angina.  She subsequently underwent left heart catheterization and was noted to have a 50% LAD stenosis and hemodynamic assessment noted no significant lesions.  Echocardiogram noted LVEF 35-40% with regional wall motion abnormality suggestive of LAD involvement.  Given her presentation, and findings on echocardiogram and heart catheterization there was concern for either stress-induced cardiomyopathy versus plaque erosion at the mid LAD.  It was recommended that the patient be started on dual antiplatelet therapy, statin therapy, and beta-blockers.  Over the last several office visits her antianginal therapy has also been uptitrated and she was started on Entresto at the last office visit.  Patient has done well with up titration of Imdur; however, with Entresto patient states that she is been having bouts of nonproductive cough.  She also has other confounding factors such as asthma and GERD that may be contributing to her cough as well.  No hospitalizations or urgent  care visits since last office encounter.  However, she did contract COVID for the third time since the pandemic.  FUNCTIONAL STATUS: No structured exercise program or daily routine. But is active on her feet during two working days.    ALLERGIES: Allergies  Allergen Reactions   Hydrocodone-Acetaminophen     Other reaction(s): Other hyperactivity   Sumatriptan     Other reaction(s): Other Head pain    Ambien [Zolpidem Tartrate] Other (See Comments)    headaches   Cheese Other (See Comments)    Causes migraine   Codeine Other (See Comments)    hyperactivity   Benadryl [Diphenhydramine] Rash    Topical only, makes rash worse   Penicillins Nausea And Vomiting and Rash     MEDICATION LIST PRIOR TO VISIT: Current Outpatient Medications on File Prior to Visit  Medication Sig Dispense Refill   amitriptyline (ELAVIL) 100 MG tablet Take 100 mg by mouth at bedtime.     aspirin EC 81 MG EC tablet Take 1 tablet (81 mg total) by mouth daily. Swallow whole. 30 tablet 11   buPROPion (WELLBUTRIN XL) 150 MG 24 hr tablet Take 1 tablet (150 mg total) by mouth daily. PATIENT NEEDS OFFICE VISIT FOR ADDITIONAL REFILLS 90 tablet 3   Calcium Carb-Cholecalciferol 500-125 MG-UNIT TABS Take 1 tablet by mouth daily.     Cholecalciferol (VITAMIN D) 125 MCG (5000 UT) CAPS Take 1 tablet by mouth daily.     clopidogrel (PLAVIX) 75 MG tablet TAKE ONE TABLET BY MOUTH DAILY 90 tablet 1   ENTRESTO 24-26 MG TAKE ONE TABLET BY MOUTH TWICE A DAY 180 tablet 1   fluticasone (FLONASE) 50 MCG/ACT nasal spray Place 2 sprays into both nostrils daily  as needed for allergies or rhinitis.     Glucosamine-Chondroitin (MOVE FREE PO) Take 1 tablet by mouth daily.     hydrOXYzine (ATARAX/VISTARIL) 50 MG tablet Take 50 mg by mouth 2 (two) times daily.     hydrOXYzine (ATARAX/VISTARIL) 50 MG tablet Take 1 tablet by mouth as needed.     KRILL OIL PO Take 1 capsule by mouth daily.      MAGNESIUM PO Take 400 mg by mouth daily.      metoprolol succinate (TOPROL-XL) 25 MG 24 hr tablet TAKE ONE TABLET BY MOUTH DAILY 90 tablet 1   Multiple Vitamins-Minerals (OCUVITE ADULT 50+ PO) Take 1 tablet by mouth daily.     nitroGLYCERIN (NITROSTAT) 0.4 MG SL tablet Place 1 tablet (0.4 mg total) under the tongue every 5 (five) minutes as needed for chest pain (Up to 3 doses in 15 minutes.). 30 tablet 2   NON FORMULARY Take by mouth daily. Morning Complete powder     Nutritional Supplements (KETO PO) Take 2 tablets by mouth daily. EXIPURE     OVER THE COUNTER MEDICATION Take 1 tablet by mouth daily. Garden of Life Probiotic for Women     PARoxetine (PAXIL) 30 MG tablet TAKE 1 TABLET (30 MG TOTAL) BY MOUTH DAILY. MUST SCHEDULE ANNUAL PHYSICAL FOR FURTHER REFILLS (Patient taking differently: Take 30 mg by mouth daily.) 30 tablet 0   RABEprazole (ACIPHEX) 20 MG tablet Take 20 mg by mouth 2 (two) times daily.     rizatriptan (MAXALT) 10 MG tablet Take 10 mg by mouth as needed for migraine.      rosuvastatin (CRESTOR) 10 MG tablet Take 1 tablet (10 mg total) by mouth at bedtime. 30 tablet 2   TURMERIC PO Take 2,250 mg by mouth in the morning and at bedtime.     No current facility-administered medications on file prior to visit.    PAST MEDICAL HISTORY: Past Medical History:  Diagnosis Date   Allergy    Anxiety    Arthritis    Coronary atherosclerosis of native coronary artery    Depression    GERD (gastroesophageal reflux disease)    Hyperlipidemia    LBBB (left bundle branch block)    Migraine     PAST SURGICAL HISTORY: Past Surgical History:  Procedure Laterality Date   ABDOMINAL HYSTERECTOMY  1995   FOOT SURGERY     INTRAVASCULAR PRESSURE WIRE/FFR STUDY N/A 10/09/2020   Procedure: INTRAVASCULAR PRESSURE WIRE/FFR STUDY;  Surgeon: Nigel Mormon, MD;  Location: [facility];  Service: Cardiovascular;  Laterality: N/A;   LEFT HEART CATH AND CORONARY ANGIOGRAPHY N/A 10/09/2020   Procedure: LEFT HEART CATH AND  CORONARY ANGIOGRAPHY;  Surgeon: Nigel Mormon, MD;  Location: [facility] [facility];  Service: Cardiovascular;  Laterality: N/A;    FAMILY HISTORY: The patient's family history includes Arthritis in her mother and paternal grandmother; Cancer in her maternal grandmother; Colon cancer (age of onset: 8) in her mother; Heart disease in her father and paternal grandmother; Hypertension in her brother; Parkinson's disease in her father.   SOCIAL HISTORY:  The patient  reports that she has never smoked. She has never used smokeless tobacco. She reports current alcohol use. She reports that she does not use drugs.  Review of Systems  Constitutional: Positive for weight loss. Negative for chills, fever and weight gain.  HENT:  Negative for hoarse voice and nosebleeds.   Eyes:  Negative for discharge, double vision and pain.  Cardiovascular:  Positive  for dyspnea on exertion (Improving). Negative for chest pain, claudication, leg swelling, near-syncope, orthopnea, palpitations, paroxysmal nocturnal dyspnea and syncope.  Respiratory:  Negative for hemoptysis and shortness of breath.   Musculoskeletal:  Negative for muscle cramps and myalgias.  Gastrointestinal:  Negative for abdominal pain, constipation, diarrhea, hematemesis, hematochezia, melena, nausea and vomiting.  Neurological:  Negative for dizziness and light-headedness.   PHYSICAL EXAM: Vitals with BMI 08/30/2021 08/30/2021 02/28/2021  Height - 5\' 5"  5\' 5"   Weight - 215 lbs 10 oz 229 lbs  BMI - 17.00 17.49  Systolic 449 675 916  Diastolic 71 76 82  Pulse 81 94 72   CONSTITUTIONAL: Well-developed and well-nourished. No acute distress.  SKIN: Skin is warm and dry. No rash noted. No cyanosis. No pallor. No jaundice HEAD: Normocephalic and atraumatic.  EYES: No scleral icterus MOUTH/THROAT: Moist oral membranes.  NECK: No JVD present. No thyromegaly noted. No carotid bruits  LYMPHATIC: No visible cervical adenopathy.  CHEST Normal  respiratory effort. No intercostal retractions  LUNGS: Clear to auscultation bilaterally. No stridor. No wheezes. No rales.  CARDIOVASCULAR: Regular rate and rhythm, positive S1-S2, no murmurs rubs or gallops appreciated. ABDOMINAL: Obese, soft, nontender, nondistended, positive bowel sounds all 4 quadrants. No apparent ascites.  EXTREMITIES: No peripheral edema  HEMATOLOGIC: No significant bruising NEUROLOGIC: Oriented to person, place, and time. Nonfocal. Normal muscle tone.  PSYCHIATRIC: Normal mood and affect. Normal behavior. Cooperative  CARDIAC DATABASE: EKG: 08/30/2021: Normal sinus rhythm, 78 bpm, left bundle branch block, ST-T changes most likely secondary to LBBB.    Echocardiogram: 10/08/2020 LVEF improved from 35-40% to 40-45% otherwise no significant change.  12/18/2020: Mildly depressed LV systolic function with visual EF 40-45%. Left ventricle cavity is normal in size. Abnormal septal wall motion due to left bundle branch block. Doppler evidence of grade I (impaired) diastolic dysfunction, normal LAP. Left ventricle regional wall motion findings: Mid anteroseptal, Mid inferoseptal and Apical septal hypokinesis. Trace aortic regurgitation. Mild (Grade I) mitral regurgitation. Compared to prior study dated   Stress Testing:  No results found for this or any previous visit from the past 1095 days.  Heart Catheterization: 10/09/2020  LM: Normal  LAD: Type 1 vessel that does not reach apex          Focal 50% LAD stenosis without involvement of adjacent diagonal branch  [place]: Normal  LCx: Normal  RCA: Large, super-dominant, supplies apex. Normal vessel   LABORATORY DATA: CBC Latest Ref Rng & Units 10/09/2020 10/07/2020 12/04/2014  WBC 4.0 - 10.5 K/uL 10.1 12.8(H) 9.9  Hemoglobin 12.0 - 15.0 g/dL 12.3 13.1 14.0  Hematocrit 36.0 - 46.0 % 39.9 42.2 43.1  Platelets 150 - 400 K/uL 353 370 304.0    CMP Latest Ref Rng & Units 03/14/2021 10/08/2020 10/07/2020  Glucose 65 - 99  mg/dL 95 103(H) 101(H)  BUN 8 - 27 mg/dL 11 13 14   Creatinine 0.57 - 1.00 mg/dL 0.84 0.93 1.01(H)  Sodium 134 - 144 mmol/L 142 135 138  Potassium 3.5 - 5.2 mmol/L 4.3 3.7 4.0  Chloride 96 - 106 mmol/L 102 102 104  CO2 20 - 29 mmol/L 23 23 21(L)  Calcium 8.7 - 10.3 mg/dL 9.3 8.8(L) 9.2  Total Protein 6.0 - 8.3 g/dL - - -  Total Bilirubin 0.2 - 1.2 mg/dL - - -  Alkaline Phos 39 - 117 U/L - - -  AST 0 - 37 U/L - - -  ALT 0 - 35 U/L - - -    Lipid Panel  Component Value Date/Time   CHOL 168 10/08/2020 0357   TRIG 202 (H) 10/08/2020 0357   HDL 45 10/08/2020 0357   CHOLHDL 3.7 10/08/2020 0357   VLDL 40 10/08/2020 0357   LDLCALC 83 10/08/2020 0357   LDLDIRECT 90.6 12/04/2014 1358    Lab Results  Component Value Date   HGBA1C 5.7 01/16/2014   No components found for: NTPROBNP Lab Results  Component Value Date   TSH 0.79 01/16/2014    Cardiac Panel (last 3 results) No results for input(s): CKTOTAL, CKMB, TROPONINIHS, RELINDX in the last 72 hours.  IMPRESSION:    ICD-10-CM   1. Nonobstructive atherosclerosis of coronary artery  I25.10 EKG 12-Lead    isosorbide mononitrate (IMDUR) 30 MG 24 hr tablet    2. Ischemic cardiomyopathy  I25.5 isosorbide mononitrate (IMDUR) 30 MG 24 hr tablet    3. Benign hypertension  I10     4. Hypertriglyceridemia  E78.1     5. Mixed hyperlipidemia  E78.2        RECOMMENDATIONS: Mary Glass is a 66 y.o. female whose past medical history and cardiovascular risk factors include: Hypertension, coronary artery disease, recurrent heart failure, ischemic cardiomyopathy, hyperlipidemia, obesity.  Nonobstructive atherosclerosis of coronary artery Denies angina pectoris. Recently has undergone echocardiogram, left heart catheterization.  Results reviewed as a part of today's encounter. Continue current guideline directed and antianginal medical therapy No additional cardiovascular testing needed at this time. Will refill Imdur 30 mg  p.o. daily  Ischemic cardiomyopathy Overall euvolemic. No hospitalizations for congestive heart failure. Patient states that she is having symptoms of nonproductive cough likely due to [facility].  We discussed discontinuation of Entresto due to these reasons; however, I have asked her to address her underlying asthma and GERD as they may also be contributing to her symptoms.  If the symptoms are persistent and affecting her day-to-day activities she may consider discontinuation of Entresto as well. Will follow.  Benign hypertension Office blood pressures are very well controlled. Medications reconciled. Low-salt diet recommended. Monitor for now.  Mixed hyperlipidemia Currently on rosuvastatin.   She denies myalgia or other side effects. Most recent lipids dated November 2021.  Patient plans to have repeat fasting lipid profile as part of her yearly physical in the following weeks  FINAL MEDICATION LIST END OF ENCOUNTER: Meds ordered this encounter  Medications   isosorbide mononitrate (IMDUR) 30 MG 24 hr tablet    Sig: Take 1 tablet (30 mg total) by mouth daily.    Dispense:  90 tablet    Refill:  1     Medications Discontinued During This Encounter  Medication Reason   b complex vitamins capsule Error   montelukast (SINGULAIR) 10 MG tablet Error   Red Yeast Rice Extract POWD Error   amitriptyline (ELAVIL) 75 MG tablet Duplicate   isosorbide mononitrate (IMDUR) 30 MG 24 hr tablet      Current Outpatient Medications:    amitriptyline (ELAVIL) 100 MG tablet, Take 100 mg by mouth at bedtime., Disp: , Rfl:    aspirin EC 81 MG EC tablet, Take 1 tablet (81 mg total) by mouth daily. Swallow whole., Disp: 30 tablet, Rfl: 11   buPROPion (WELLBUTRIN XL) 150 MG 24 hr tablet, Take 1 tablet (150 mg total) by mouth daily. PATIENT NEEDS OFFICE VISIT FOR ADDITIONAL REFILLS, Disp: 90 tablet, Rfl: 3   Calcium Carb-Cholecalciferol 500-125 MG-UNIT TABS, Take 1 tablet by mouth daily., Disp: ,  Rfl:    Cholecalciferol (VITAMIN D) 125 MCG (5000 UT) CAPS,  Take 1 tablet by mouth daily., Disp: , Rfl:    clopidogrel (PLAVIX) 75 MG tablet, TAKE ONE TABLET BY MOUTH DAILY, Disp: 90 tablet, Rfl: 1   ENTRESTO 24-26 MG, TAKE ONE TABLET BY MOUTH TWICE A DAY, Disp: 180 tablet, Rfl: 1   fluticasone (FLONASE) 50 MCG/ACT nasal spray, Place 2 sprays into both nostrils daily as needed for allergies or rhinitis., Disp: , Rfl:    Glucosamine-Chondroitin (MOVE FREE PO), Take 1 tablet by mouth daily., Disp: , Rfl:    hydrOXYzine (ATARAX/VISTARIL) 50 MG tablet, Take 50 mg by mouth 2 (two) times daily., Disp: , Rfl:    hydrOXYzine (ATARAX/VISTARIL) 50 MG tablet, Take 1 tablet by mouth as needed., Disp: , Rfl:    KRILL OIL PO, Take 1 capsule by mouth daily. , Disp: , Rfl:    MAGNESIUM PO, Take 400 mg by mouth daily., Disp: , Rfl:    metoprolol succinate (TOPROL-XL) 25 MG 24 hr tablet, TAKE ONE TABLET BY MOUTH DAILY, Disp: 90 tablet, Rfl: 1   Multiple Vitamins-Minerals (OCUVITE ADULT 50+ PO), Take 1 tablet by mouth daily., Disp: , Rfl:    nitroGLYCERIN (NITROSTAT) 0.4 MG SL tablet, Place 1 tablet (0.4 mg total) under the tongue every 5 (five) minutes as needed for chest pain (Up to 3 doses in 15 minutes.)., Disp: 30 tablet, Rfl: 2   NON FORMULARY, Take by mouth daily. Morning Complete powder, Disp: , Rfl:    Nutritional Supplements (KETO PO), Take 2 tablets by mouth daily. EXIPURE, Disp: , Rfl:    OVER THE COUNTER MEDICATION, Take 1 tablet by mouth daily. Garden of Life Probiotic for Women, Disp: , Rfl:    PARoxetine (PAXIL) 30 MG tablet, TAKE 1 TABLET (30 MG TOTAL) BY MOUTH DAILY. MUST SCHEDULE ANNUAL PHYSICAL FOR FURTHER REFILLS (Patient taking differently: Take 30 mg by mouth daily.), Disp: 30 tablet, Rfl: 0   RABEprazole (ACIPHEX) 20 MG tablet, Take 20 mg by mouth 2 (two) times daily., Disp: , Rfl:    rizatriptan (MAXALT) 10 MG tablet, Take 10 mg by mouth as needed for migraine. , Disp: , Rfl:    rosuvastatin  (CRESTOR) 10 MG tablet, Take 1 tablet (10 mg total) by mouth at bedtime., Disp: 30 tablet, Rfl: 2   TURMERIC PO, Take 2,250 mg by mouth in the morning and at bedtime., Disp: , Rfl:    isosorbide mononitrate (IMDUR) 30 MG 24 hr tablet, Take 1 tablet (30 mg total) by mouth daily., Disp: 90 tablet, Rfl: 1  Orders Placed This Encounter  Procedures   EKG 12-Lead   --Continue cardiac medications as reconciled in final medication list. --Return in about 6 months (around 02/28/2022) for Follow up, CAD, heart failure management.. Or sooner if needed. --Continue follow-up with your primary care physician regarding the management of your other chronic comorbid conditions.  Patient's questions and concerns were addressed to her satisfaction. She voices understanding of the instructions provided during this encounter.   This note was created using a voice recognition software as a result there may be grammatical errors inadvertently enclosed that do not reflect the nature of this encounter. Every attempt is made to correct such errors.  Rex Kras, [place], [facility]  Pager: [phone] Office: [phone]

## 2021-10-03 ENCOUNTER — Other Ambulatory Visit: Payer: Self-pay | Admitting: Cardiology

## 2021-10-29 ENCOUNTER — Other Ambulatory Visit: Payer: Self-pay

## 2021-10-29 MED ORDER — CLOPIDOGREL BISULFATE 75 MG PO TABS: 75.0000 mg | ORAL_TABLET | Freq: Every day | ORAL | 1 refills | Status: DC

## 2021-10-29 MED ORDER — METOPROLOL SUCCINATE ER 25 MG PO TB24: 25.0000 mg | ORAL_TABLET | Freq: Every day | ORAL | 1 refills | Status: DC

## 2022-02-27 ENCOUNTER — Ambulatory Visit: Payer: Medicare HMO | Admitting: Cardiology

## 2022-03-02 ENCOUNTER — Other Ambulatory Visit: Payer: Self-pay | Admitting: Cardiology

## 2022-03-02 DIAGNOSIS — I255 Ischemic cardiomyopathy: Secondary | ICD-10-CM

## 2022-03-14 ENCOUNTER — Other Ambulatory Visit: Payer: Self-pay | Admitting: Cardiology

## 2022-03-14 DIAGNOSIS — I255 Ischemic cardiomyopathy: Secondary | ICD-10-CM

## 2022-03-14 DIAGNOSIS — I2510 Atherosclerotic heart disease of native coronary artery without angina pectoris: Secondary | ICD-10-CM

## 2022-03-18 ENCOUNTER — Ambulatory Visit: Payer: Medicare HMO | Admitting: Cardiology

## 2022-04-09 ENCOUNTER — Ambulatory Visit (INDEPENDENT_AMBULATORY_CARE_PROVIDER_SITE_OTHER): Payer: Medicare HMO

## 2022-04-09 ENCOUNTER — Ambulatory Visit (HOSPITAL_COMMUNITY)
Admission: EM | Admit: 2022-04-09 | Discharge: 2022-04-09 | Disposition: A | Discharge status: Home / Self Care | Payer: Medicare HMO | Attending: Emergency Medicine | Admitting: Emergency Medicine

## 2022-04-09 ENCOUNTER — Encounter (HOSPITAL_COMMUNITY): Payer: Self-pay

## 2022-04-09 DIAGNOSIS — M79671 Pain in right foot: Secondary | ICD-10-CM | POA: Diagnosis not present

## 2022-04-09 DIAGNOSIS — S92354A Nondisplaced fracture of fifth metatarsal bone, right foot, initial encounter for closed fracture: Secondary | ICD-10-CM | POA: Diagnosis not present

## 2022-04-09 DIAGNOSIS — W19XXXA Unspecified fall, initial encounter: Secondary | ICD-10-CM | POA: Diagnosis not present

## 2022-04-09 MED ORDER — TRAMADOL HCL 50 MG PO TABS: 50.0000 mg | ORAL_TABLET | Freq: Four times a day (QID) | ORAL | 0 refills | Status: DC | PRN

## 2022-04-09 NOTE — ED Triage Notes (Signed)
Pt c/o right ankle pain x 3 days.She reports hearing a popping sound.  ?Pt reports falling last night.  ?

## 2022-04-09 NOTE — ED Provider Notes (Signed)
[place]    CSN: [id number] Arrival date & time: 04/09/22  1635      History   Chief Complaint Chief Complaint  Patient presents with   Fall   Ankle Pain    HPI Mary Glass is a 66 y.o. female. Rolled foot 1.5 weeks ago. Has been walking on it. Rolled it again last night. Using tylenol for pain due to cardiac history.  Patient has pain in right foot in the lateral midfoot area.  Area mildly swollen.  Skin intact.   Fall  Ankle Pain  Past Medical History:  Diagnosis Date   Allergy    Anxiety    Arthritis    Coronary atherosclerosis of native coronary artery    Depression    GERD (gastroesophageal reflux disease)    Hyperlipidemia    LBBB (left bundle branch block)    Migraine     Patient Active Problem List   Diagnosis Date Noted   Unstable angina ([place])    Chest pain 10/07/2020   HLD (hyperlipidemia) 05/18/2014   Toenail fungus 02/15/2014   Obesity (BMI 30-39.9) 01/16/2014   Insomnia 01/16/2014   Hot flashes 01/16/2014   Migraine 12/27/2012   Depression 12/27/2012   GERD (gastroesophageal reflux disease) 12/27/2012    Past Surgical History:  Procedure Laterality Date   ABDOMINAL HYSTERECTOMY  1995   FOOT SURGERY     INTRAVASCULAR PRESSURE WIRE/FFR STUDY N/A 10/09/2020   Procedure: INTRAVASCULAR PRESSURE WIRE/FFR STUDY;  Surgeon: Nigel Mormon, MD;  Location: [facility];  Service: Cardiovascular;  Laterality: N/A;   LEFT HEART CATH AND CORONARY ANGIOGRAPHY N/A 10/09/2020   Procedure: LEFT HEART CATH AND CORONARY ANGIOGRAPHY;  Surgeon: Nigel Mormon, MD;  Location: [place] [facility];  Service: Cardiovascular;  Laterality: N/A;    OB History   No obstetric history on file.      Home Medications    Prior to Admission medications   Medication Sig Start Date End Date Taking? Authorizing Provider  traMADol (ULTRAM) 50 MG tablet Take 1 tablet (50 mg total) by mouth every 6 (six) hours as needed. 04/09/22  Yes  Carvel Getting, NP  amitriptyline (ELAVIL) 100 MG tablet Take 100 mg by mouth at bedtime. 03/18/21   [provider]  ASPIRIN LOW DOSE 81 MG EC tablet TAKE ONE TABLET BY MOUTH DAILY; SWALLOW WHOLE AS DIRECTED BY YOUR DOCTOR 10/03/21   Tolia, Sunit, DO  buPROPion (WELLBUTRIN XL) 150 MG 24 hr tablet Take 1 tablet (150 mg total) by mouth daily. PATIENT NEEDS OFFICE VISIT FOR ADDITIONAL REFILLS 12/04/14   Jearld Fenton, NP  Calcium Carb-Cholecalciferol 500-125 MG-UNIT TABS Take 1 tablet by mouth daily.    [provider]  Cholecalciferol (VITAMIN D) 125 MCG (5000 UT) CAPS Take 1 tablet by mouth daily.    [provider]  clopidogrel (PLAVIX) 75 MG tablet Take 1 tablet (75 mg total) by mouth daily. 10/29/21   Tolia, Sunit, DO  ENTRESTO 24-26 MG TAKE ONE TABLET BY MOUTH TWICE A DAY 03/03/22   Tolia, Sunit, DO  fluticasone (FLONASE) 50 MCG/ACT nasal spray Place 2 sprays into both nostrils daily as needed for allergies or rhinitis.    [provider]  Glucosamine-Chondroitin (MOVE FREE PO) Take 1 tablet by mouth daily.    [provider]  hydrOXYzine (ATARAX/VISTARIL) 50 MG tablet Take 50 mg by mouth 2 (two) times daily. 06/14/21   [provider]  hydrOXYzine (ATARAX/VISTARIL) 50 MG tablet Take 1  tablet by mouth as needed. 07/29/21   [provider]  isosorbide mononitrate (IMDUR) 30 MG 24 hr tablet TAKE ONE TABLET BY MOUTH DAILY 03/14/22   Tolia, Sunit, DO  KRILL OIL PO Take 1 capsule by mouth daily.     [provider]  MAGNESIUM PO Take 400 mg by mouth daily.    [provider]  metoprolol succinate (TOPROL-XL) 25 MG 24 hr tablet Take 1 tablet (25 mg total) by mouth daily. 10/29/21   Tolia, Sunit, DO  Multiple Vitamins-Minerals (OCUVITE ADULT 50+ PO) Take 1 tablet by mouth daily.    [provider]  nitroGLYCERIN (NITROSTAT) 0.4 MG SL tablet Place 1 tablet (0.4 mg total) under the tongue every 5 (five) minutes as needed  for chest pain (Up to 3 doses in 15 minutes.). 10/09/20   Patwardhan, Reynold Bowen, MD  NON FORMULARY Take by mouth daily. Morning Complete powder    [provider]  Nutritional Supplements (KETO PO) Take 2 tablets by mouth daily. EXIPURE    [provider]  OVER THE COUNTER MEDICATION Take 1 tablet by mouth daily. Garden of Life Probiotic for Women    [provider]  PARoxetine (PAXIL) 30 MG tablet TAKE 1 TABLET (30 MG TOTAL) BY MOUTH DAILY. MUST SCHEDULE ANNUAL PHYSICAL FOR FURTHER REFILLS Patient taking differently: Take 30 mg by mouth daily. 06/03/16   Jearld Fenton, NP  RABEprazole (ACIPHEX) 20 MG tablet Take 20 mg by mouth 2 (two) times daily. 07/24/20   [provider]  rizatriptan (MAXALT) 10 MG tablet Take 10 mg by mouth as needed for migraine.  09/25/20   [provider]  rosuvastatin (CRESTOR) 10 MG tablet Take 1 tablet (10 mg total) by mouth at bedtime. 10/09/20   Patwardhan, Reynold Bowen, MD  TURMERIC PO Take 2,250 mg by mouth in the morning and at bedtime.    [provider]    Family History Family History  Problem Relation Age of Onset   Arthritis Mother    Colon cancer Mother 31   Heart disease Father    Parkinson's disease Father    Hypertension Brother    Cancer Maternal Grandmother    Heart disease Paternal Grandmother    Arthritis Paternal Grandmother     Social History Social History   Tobacco Use   Smoking status: Never   Smokeless tobacco: Never  Vaping Use   Vaping Use: Never used  Substance Use Topics   Alcohol use: Yes    Comment: occ   Drug use: No     Allergies   Hydrocodone-acetaminophen, Sumatriptan, Ambien [zolpidem tartrate], Cheese, Codeine, Benadryl [diphenhydramine], and Penicillins   Review of Systems Review of Systems   Physical Exam Triage Vital Signs ED Triage Vitals [04/09/22 1728]  Enc Vitals Group     BP 112/61     Pulse Rate 70     Resp 16     Temp 97.9 F (36.6 C)      Temp Source Oral     SpO2 98 %     Weight      Height      Head Circumference      Peak Flow      Pain Score      Pain Loc      Pain Edu?      Excl. in [place]?    No data found.  Updated Vital Signs BP 112/61 (BP Location: Left Arm)   Pulse 70   Temp 97.9  F (36.6 C) (Oral)   Resp 16   SpO2 98%   Visual Acuity Right Eye Distance:   Left Eye Distance:   Bilateral Distance:    Right Eye Near:   Left Eye Near:    Bilateral Near:     Physical Exam Constitutional:      General: She is not in acute distress.    Appearance: Normal appearance. She is ill-appearing.  Cardiovascular:     Pulses:          Dorsalis pedis pulses are 2+ on the right side.  Pulmonary:     Effort: Pulmonary effort is normal.  Musculoskeletal:     Right foot: Normal capillary refill. Swelling, tenderness and bony tenderness present. No deformity. Normal pulse.  Neurological:     Mental Status: She is alert.     UC Treatments / Results  Labs (all labs ordered are listed, but only abnormal results are displayed) Labs Reviewed - No data to display  EKG   Radiology DG Foot Complete Right  Result Date: 04/09/2022 CLINICAL DATA:  Fall. EXAM: RIGHT FOOT COMPLETE - 3+ VIEW COMPARISON:  None Available. FINDINGS: Nondisplaced avulsion fracture of the base of the fifth metatarsal. No other acute fracture. There is no dislocation. The bones are well mineralized. No arthritic changes. The soft tissues are unremarkable. IMPRESSION: Nondisplaced avulsion fracture of the base of the fifth metatarsal. Electronically Signed   By: Anner Crete M.D.   On: 04/09/2022 18:31    Procedures Procedures (including critical care time)  Medications Ordered in UC Medications - No data to display  Initial Impression / Assessment and Plan / UC Course  I have reviewed the triage vital signs and the nursing notes.  Pertinent labs & imaging results that were available during my care of the patient were reviewed by me  and considered in my medical decision making (see chart for details).  Clinical Course as of 04/09/22 1906  Wed Apr 09, 2022  1823 DG Foot Complete Right [AK]  1840 DG Foot Complete Right [AK]  1841 DG Foot Complete Right [AK]    Clinical Course User Index [AK] Carvel Getting, NP    Discussed follow-up options with patient patient is familiar with more feeling her practice and prefers her follow-up be there.  Offered postop shoe and Ace wrap versus cam walker.  Patient elects postop shoe and Ace wrap.  Tylenol is not sufficient to manage patient's pain but patient cannot take NSAIDs.  Patient has taken tramadol in the past which is successfully treated her pain.  Given prescription for tramadol  Final Clinical Impressions(s) / UC Diagnoses   Final diagnoses:  Closed nondisplaced fracture of fifth metatarsal bone of right foot, initial encounter     Discharge Instructions      It's ok to walk on this type of fracture. Follow up with Ralie[facility] Ip.    ED Prescriptions     Medication Sig Dispense Auth. Provider   traMADol (ULTRAM) 50 MG tablet Take 1 tablet (50 mg total) by mouth every 6 (six) hours as needed. 15 tablet Carvel Getting, NP      I have reviewed the PDMP during this encounter.   Carvel Getting, NP 04/13/22 2139

## 2022-04-09 NOTE — Discharge Instructions (Signed)
It's ok to walk on this type of fracture. Follow up with Raliegh Ip.  ?

## 2022-04-25 ENCOUNTER — Other Ambulatory Visit: Payer: Self-pay | Admitting: Cardiology

## 2022-04-26 ENCOUNTER — Other Ambulatory Visit: Payer: Self-pay | Admitting: Cardiology

## 2022-05-05 ENCOUNTER — Emergency Department (HOSPITAL_BASED_OUTPATIENT_CLINIC_OR_DEPARTMENT_OTHER): Payer: Medicare HMO | Admitting: Radiology

## 2022-05-05 ENCOUNTER — Other Ambulatory Visit: Payer: Self-pay

## 2022-05-05 ENCOUNTER — Encounter (HOSPITAL_BASED_OUTPATIENT_CLINIC_OR_DEPARTMENT_OTHER): Payer: Self-pay | Admitting: *Deleted

## 2022-05-05 ENCOUNTER — Emergency Department (HOSPITAL_BASED_OUTPATIENT_CLINIC_OR_DEPARTMENT_OTHER)
Admission: EM | Admit: 2022-05-05 | Discharge: 2022-05-06 | Disposition: A | Discharge status: Home / Self Care | Payer: Medicare HMO | Attending: Emergency Medicine | Admitting: Emergency Medicine

## 2022-05-05 ENCOUNTER — Emergency Department (HOSPITAL_BASED_OUTPATIENT_CLINIC_OR_DEPARTMENT_OTHER): Payer: Medicare HMO

## 2022-05-05 DIAGNOSIS — Z7982 Long term (current) use of aspirin: Secondary | ICD-10-CM | POA: Diagnosis not present

## 2022-05-05 DIAGNOSIS — I2510 Atherosclerotic heart disease of native coronary artery without angina pectoris: Secondary | ICD-10-CM | POA: Diagnosis not present

## 2022-05-05 DIAGNOSIS — Z7902 Long term (current) use of antithrombotics/antiplatelets: Secondary | ICD-10-CM | POA: Diagnosis not present

## 2022-05-05 DIAGNOSIS — R079 Chest pain, unspecified: Secondary | ICD-10-CM | POA: Insufficient documentation

## 2022-05-05 DIAGNOSIS — Z79899 Other long term (current) drug therapy: Secondary | ICD-10-CM | POA: Insufficient documentation

## 2022-05-05 LAB — TROPONIN I (HIGH SENSITIVITY): Troponin I (High Sensitivity): 4 ng/L (ref <18)

## 2022-05-05 LAB — BASIC METABOLIC PANEL
Anion gap: 12 (ref 5–15)
BUN: 10 mg/dL (ref 8–23)
CO2: 22 mmol/L (ref 22–32)
Calcium: 9.7 mg/dL (ref 8.9–10.3)
Chloride: 106 mmol/L (ref 98–111)
Creatinine, Ser: 1 mg/dL (ref 0.44–1.00)
GFR, Estimated: >60 mL/min (ref >60)
Glucose, Bld: 120 mg/dL — ABNORMAL HIGH (ref 70–99)
Potassium: 4.5 mmol/L (ref 3.5–5.1)
Sodium: 140 mmol/L (ref 135–145)

## 2022-05-05 LAB — CBC
HCT: 41.1 % (ref 36.0–46.0)
Hemoglobin: 13.3 g/dL (ref 12.0–15.0)
MCH: 27.3 pg (ref 26.0–34.0)
MCHC: 32.4 g/dL (ref 30.0–36.0)
MCV: 84.4 fL (ref 80.0–100.0)
Platelets: 333 10*3/uL (ref 150–400)
RBC: 4.87 MIL/uL (ref 3.87–5.11)
RDW: 14 % (ref 11.5–15.5)
WBC: 9.5 10*3/uL (ref 4.0–10.5)
nRBC: 0 % (ref 0.0–0.2)

## 2022-05-05 MED ORDER — IOHEXOL 350 MG/ML SOLN
100.0000 mL | Freq: Once | INTRAVENOUS | Status: AC | PRN
  Administered 2022-05-05: 75 mL via INTRAVENOUS

## 2022-05-05 MED ORDER — ASPIRIN 81 MG PO CHEW
324.0000 mg | CHEWABLE_TABLET | Freq: Once | ORAL | Status: AC
  Administered 2022-05-05: 324 mg via ORAL
  Filled 2022-05-05: qty 4

## 2022-05-05 NOTE — ED Triage Notes (Signed)
Pt is here with CP that began this afternoon.  Pain in left side of chest.  Pt took sl nitro which gave her relief for a short time.

## 2022-05-05 NOTE — ED Provider Notes (Signed)
[place] [facility]  Provider Note  CSN: [id number] Arrival date & time: 05/05/22 2111  History Chief Complaint  Patient presents with   Chest Pain    Mary Glass is a 66 y.o. female with history of non-obstructive CAD (50% LAD lesion on [facility] Nov 2021) and ischemic cardiomyopathy followed by Dr. Terri Skains, managed with ASA/Plavix, Imdur, Entresto and NTG SL reports multiple episodes of sharp L sided chest pain since around 1600hrs on day of arrival. Comes and goes, helped some with NTG. Associated with diaphoresis but no SOB or nausea. Does not radiate. She also recently had a foot fracture and is wearing a cam walker. Denies any leg swelling recently.    Home Medications Prior to Admission medications   Medication Sig Start Date End Date Taking? Authorizing Provider  ranolazine (RANEXA) 500 MG 12 hr tablet Take 1 tablet (500 mg total) by mouth 2 (two) times daily. 05/06/22 06/05/22 Yes Truddie Hidden, MD  amitriptyline (ELAVIL) 100 MG tablet Take 100 mg by mouth at bedtime. 03/18/21   [provider]  ASPIRIN LOW DOSE 81 MG EC tablet TAKE ONE TABLET BY MOUTH DAILY; SWALLOW WHOLE AS DIRECTED BY YOUR DOCTOR 10/03/21   Tolia, Sunit, DO  buPROPion (WELLBUTRIN XL) 150 MG 24 hr tablet Take 1 tablet (150 mg total) by mouth daily. PATIENT NEEDS OFFICE VISIT FOR ADDITIONAL REFILLS 12/04/14   Jearld Fenton, NP  Calcium Carb-Cholecalciferol 500-125 MG-UNIT TABS Take 1 tablet by mouth daily.    [provider]  Cholecalciferol (VITAMIN D) 125 MCG (5000 UT) CAPS Take 1 tablet by mouth daily.    [provider]  clopidogrel (PLAVIX) 75 MG tablet TAKE ONE TABLET BY MOUTH DAILY 04/28/22   Tolia, Sunit, DO  ENTRESTO 24-26 MG TAKE ONE TABLET BY MOUTH TWICE A DAY 03/03/22   Tolia, Sunit, DO  fluticasone (FLONASE) 50 MCG/ACT nasal spray Place 2 sprays into both nostrils daily as needed for allergies or rhinitis.    [provider]   Glucosamine-Chondroitin (MOVE FREE PO) Take 1 tablet by mouth daily.    [provider]  hydrOXYzine (ATARAX/VISTARIL) 50 MG tablet Take 50 mg by mouth 2 (two) times daily. 06/14/21   [provider]  hydrOXYzine (ATARAX/VISTARIL) 50 MG tablet Take 1 tablet by mouth as needed. 07/29/21   [provider]  isosorbide mononitrate (IMDUR) 30 MG 24 hr tablet TAKE ONE TABLET BY MOUTH DAILY 03/14/22   Tolia, Sunit, DO  KRILL OIL PO Take 1 capsule by mouth daily.     [provider]  MAGNESIUM PO Take 400 mg by mouth daily.    [provider]  metoprolol succinate (TOPROL-XL) 25 MG 24 hr tablet Take 1 tablet (25 mg total) by mouth daily. 10/29/21   Tolia, Sunit, DO  Multiple Vitamins-Minerals (OCUVITE ADULT 50+ PO) Take 1 tablet by mouth daily.    [provider]  nitroGLYCERIN (NITROSTAT) 0.4 MG SL tablet Place 1 tablet (0.4 mg total) under the tongue every 5 (five) minutes as needed for chest pain (Up to 3 doses in 15 minutes.). 10/09/20   Patwardhan, Reynold Bowen, MD  NON FORMULARY Take by mouth daily. Morning Complete powder    [provider]  Nutritional Supplements (KETO PO) Take 2 tablets by mouth daily. EXIPURE    [provider]  OVER THE COUNTER MEDICATION Take 1 tablet by mouth daily. Garden of Life Probiotic for Women    [provider]  PARoxetine (PAXIL) 30 MG tablet  TAKE 1 TABLET (30 MG TOTAL) BY MOUTH DAILY. MUST SCHEDULE ANNUAL PHYSICAL FOR FURTHER REFILLS Patient taking differently: Take 30 mg by mouth daily. 06/03/16   Jearld Fenton, NP  RABEprazole (ACIPHEX) 20 MG tablet Take 20 mg by mouth 2 (two) times daily. 07/24/20   [provider]  rizatriptan (MAXALT) 10 MG tablet Take 10 mg by mouth as needed for migraine.  09/25/20   [provider]  rosuvastatin (CRESTOR) 10 MG tablet Take 1 tablet (10 mg total) by mouth at bedtime. 10/09/20   Patwardhan, Reynold Bowen, MD  traMADol (ULTRAM) 50 MG tablet  Take 1 tablet (50 mg total) by mouth every 6 (six) hours as needed. 04/09/22   Carvel Getting, NP  TURMERIC PO Take 2,250 mg by mouth in the morning and at bedtime.    [provider]     Allergies    Hydrocodone-acetaminophen, Sumatriptan, Ambien [zolpidem tartrate], Cheese, Codeine, Benadryl [diphenhydramine], and Penicillins   Review of Systems   Review of Systems Please see HPI for pertinent positives and negatives  Physical Exam BP (!) 121/102   Pulse 78   Temp 98.2 F (36.8 C) (Oral)   Resp 20   Ht '5\' 5"'$  (1.651 m)   Wt 96.6 kg   SpO2 95%   BMI 35.45 kg/m   Physical Exam Vitals and nursing note reviewed.  Constitutional:      Appearance: Normal appearance.  HENT:     Head: Normocephalic and atraumatic.     Nose: Nose normal.     Mouth/Throat:     Mouth: Mucous membranes are moist.  Eyes:     Extraocular Movements: Extraocular movements intact.     Conjunctiva/sclera: Conjunctivae normal.  Cardiovascular:     Rate and Rhythm: Normal rate.  Pulmonary:     Effort: Pulmonary effort is normal.     Breath sounds: Normal breath sounds.  Abdominal:     General: Abdomen is flat.     Palpations: Abdomen is soft.     Tenderness: There is no abdominal tenderness.  Musculoskeletal:        General: No swelling. Normal range of motion.     Cervical back: Neck supple.     Comments: R foot in cam walker, no edema  Skin:    General: Skin is warm and dry.  Neurological:     General: No focal deficit present.     Mental Status: She is alert.  Psychiatric:        Mood and Affect: Mood normal.     ED Results / Procedures / Treatments   EKG EKG Interpretation  Date/Time:  Monday May 05 2022 21:22:19 EDT Ventricular Rate:  92 PR Interval:  198 QRS Duration: 170 QT Interval:  434 QTC Calculation: 536 R Axis:   76 Text Interpretation: Normal sinus rhythm Non-specific intra-ventricular conduction block Left ventricular hypertrophy with repolarization  abnormality ( Cornell product ) Possible Inferior infarct , age undetermined Abnormal ECG No significant change since last tracing Confirmed by Isla Pence [phone]) on 05/05/2022 9:41:46 PM  Procedures Procedures  Medications Ordered in the ED Medications  aspirin chewable tablet 324 mg (324 mg Oral Given 05/05/22 2337)  iohexol (OMNIPAQUE) 350 MG/ML injection 100 mL (75 mLs Intravenous Contrast Given 05/05/22 2320)    Initial Impression and Plan  Patient here with sharp chest pains, known CAD, also has recent R foot fracture in immobilization. Initial labs done in triage show normal CBC, BMP and Trop #1. I personally viewed  the images from radiology studies and agree with radiologist interpretation: CXR is clear. Given her on-and-off pain, will check a repeat Trop. Also add CTA chest due to sharp nature of pain and recent leg immobilization. ASA '324mg'$  ordered.    ED Course   Clinical Course as of 05/06/22 0050  Tue May 06, 2022  0020 I personally viewed the images from radiology studies and agree with radiologist interpretation: CTA neg for PE, patient aware of incidental nodule. Repeat Trop is neg. Will discuss with Cardiology  [CS]  [phone] Spoke with Dr. Terri Skains, Cardiology, who is familiar with the patient. He does not recommend admission. He would like for her to start Ranexa '500mg'$  BID and he will arrange close follow up in his office for this week. Patient is amenable to this plan.  [CS]    Clinical Course User Index [CS] Truddie Hidden, MD     MDM Rules/Calculators/A&P Medical Decision Making Given presenting complaint, I considered that admission might be necessary. After review of results from ED lab and/or imaging studies, admission to the hospital is not indicated at this time.    Problems Addressed: Chest pain, unspecified type: acute illness or injury  Amount and/or Complexity of Data Reviewed Labs: ordered. Decision-making details documented in ED  Course. Radiology: ordered and independent interpretation performed. Decision-making details documented in ED Course. ECG/medicine tests: ordered and independent interpretation performed. Decision-making details documented in ED Course.  Risk OTC drugs. Prescription drug management. Decision regarding hospitalization.    Final Clinical Impression(s) / ED Diagnoses Final diagnoses:  Chest pain, unspecified type    Rx / DC Orders ED Discharge Orders          Ordered    ranolazine (RANEXA) 500 MG 12 hr tablet  2 times daily        05/06/22 0049             Truddie Hidden, MD 05/06/22 [id number]

## 2022-05-06 LAB — TROPONIN I (HIGH SENSITIVITY): Troponin I (High Sensitivity): 4 ng/L (ref <18)

## 2022-05-06 MED ORDER — RANOLAZINE ER 500 MG PO TB12: 500.0000 mg | ORAL_TABLET | Freq: Two times a day (BID) | ORAL | 0 refills | Status: DC

## 2022-05-06 NOTE — ED Notes (Signed)
Pt verbalizes understanding of discharge instructions. Opportunity for questioning and answers were provided. Pt discharged from ED to home with family.    

## 2022-05-08 NOTE — Progress Notes (Unsigned)
Mary Glass Date of Birth: [birth year] MRN: [id number] Primary Care Provider:Gordon, Cherylann Ratel, PA-C Primary Cardiologist: Rex Kras, DO, [facility]  (established care 10/08/2020)  Date: 05/12/22 Last Office Visit: 02/28/2021   Chief Complaint  Patient presents with   Hospitalization Follow-up   Chest Pain    HPI  Mary Glass is a 66 y.o.  female who presents to the office with a chief complaint of " ED follow up." Patient's past medical history and cardiovascular risk factors include: Hypertension, coronary artery disease, chronic HFrEF, ischemic cardiomyopathy, hyperlipidemia, obesity.  She was initially seen in the hospital in consultation in November 2021 for chest pain and unstable angina.  Patient subsequently underwent left heart catheterization and noted to have 50% LAD stenosis.  Echocardiogram noted LVEF 35-40% with regional wall motion abnormalities suggestive of LAD involvement. Given her presentation, and findings on echocardiogram and heart catheterization there was concern for either stress-induced cardiomyopathy versus plaque erosion at the mid LAD.  It was recommended that the patient be started on dual antiplatelet therapy, statin therapy, and beta-blockers.   Patient was last seen in the office 08/30/2021.  Since then she was evaluated inThe ED with chest pain on 05/05/2022.  Her work-up was unremarkable, therefore advised patient to start Ranexa 500 mg p.o. twice daily.  She now presents for follow-up.  She has had no recurrence of chest pain since initiating Ranexa and is tolerating this without issue.  FUNCTIONAL STATUS: No structured exercise program or daily routine. But is active on her feet during two working days.    ALLERGIES: Allergies  Allergen Reactions   Hydrocodone-Acetaminophen     Other reaction(s): Other hyperactivity   Sumatriptan     Other reaction(s): Other Head pain    Ambien [Zolpidem Tartrate] Other (See Comments)    headaches   Cheese  Other (See Comments)    Causes migraine   Codeine Other (See Comments)    hyperactivity   Benadryl [Diphenhydramine] Rash    Topical only, makes rash worse   Penicillins Nausea And Vomiting and Rash     MEDICATION LIST PRIOR TO VISIT: Current Outpatient Medications on File Prior to Visit  Medication Sig Dispense Refill   amitriptyline (ELAVIL) 100 MG tablet Take 100 mg by mouth at bedtime.     ASPIRIN LOW DOSE 81 MG EC tablet TAKE ONE TABLET BY MOUTH DAILY; SWALLOW WHOLE AS DIRECTED BY YOUR DOCTOR 30 tablet 11   BEE POLLEN PO Take 1,000 mg by mouth daily.     buPROPion (WELLBUTRIN XL) 150 MG 24 hr tablet Take 1 tablet (150 mg total) by mouth daily. PATIENT NEEDS OFFICE VISIT FOR ADDITIONAL REFILLS 90 tablet 3   Calcium Carb-Cholecalciferol 500-125 MG-UNIT TABS Take 1 tablet by mouth daily.     Cholecalciferol (VITAMIN D) 125 MCG (5000 UT) CAPS Take 1 tablet by mouth daily.     clopidogrel (PLAVIX) 75 MG tablet TAKE ONE TABLET BY MOUTH DAILY 90 tablet 1   ENTRESTO 24-26 MG TAKE ONE TABLET BY MOUTH TWICE A DAY 60 tablet 3   fluticasone (FLONASE) 50 MCG/ACT nasal spray Place 2 sprays into both nostrils daily as needed for allergies or rhinitis.     Glucosamine-Chondroitin (MOVE FREE PO) Take 1 tablet by mouth daily.     hydrOXYzine (ATARAX/VISTARIL) 50 MG tablet Take 50 mg by mouth 2 (two) times daily.     hydrOXYzine (ATARAX/VISTARIL) 50 MG tablet Take 1 tablet by mouth as needed.     isosorbide mononitrate (IMDUR) 30 MG  24 hr tablet TAKE ONE TABLET BY MOUTH DAILY 90 tablet 1   KRILL OIL PO Take 1 capsule by mouth daily.      MAGNESIUM PO Take 400 mg by mouth daily.     Multiple Vitamins-Minerals (OCUVITE ADULT 50+ PO) Take 1 tablet by mouth daily.     nitroGLYCERIN (NITROSTAT) 0.4 MG SL tablet Place 1 tablet (0.4 mg total) under the tongue every 5 (five) minutes as needed for chest pain (Up to 3 doses in 15 minutes.). 30 tablet 2   NON FORMULARY Take by mouth daily. Morning Complete  powder- kachavia     NON FORMULARY cymbotica     Nutritional Supplements (KETO PO) Take 2 tablets by mouth daily. EXIPURE     OVER THE COUNTER MEDICATION Take 1 tablet by mouth daily. Garden of Life Probiotic for Women     PARoxetine (PAXIL) 30 MG tablet TAKE 1 TABLET (30 MG TOTAL) BY MOUTH DAILY. MUST SCHEDULE ANNUAL PHYSICAL FOR FURTHER REFILLS (Patient taking differently: Take 30 mg by mouth daily.) 30 tablet 0   RABEprazole (ACIPHEX) 20 MG tablet Take 20 mg by mouth 2 (two) times daily.     rizatriptan (MAXALT) 10 MG tablet Take 10 mg by mouth as needed for migraine.      rosuvastatin (CRESTOR) 10 MG tablet Take 1 tablet (10 mg total) by mouth at bedtime. 30 tablet 2   TURMERIC PO Take 2,250 mg by mouth in the morning and at bedtime.     traMADol (ULTRAM) 50 MG tablet Take 1 tablet (50 mg total) by mouth every 6 (six) hours as needed. 15 tablet 0   No current facility-administered medications on file prior to visit.    PAST MEDICAL HISTORY: Past Medical History:  Diagnosis Date   Allergy    Anxiety    Arthritis    Coronary atherosclerosis of native coronary artery    Depression    GERD (gastroesophageal reflux disease)    Hyperlipidemia    LBBB (left bundle branch block)    Migraine     PAST SURGICAL HISTORY: Past Surgical History:  Procedure Laterality Date   ABDOMINAL HYSTERECTOMY  1995   FOOT SURGERY     INTRAVASCULAR PRESSURE WIRE/FFR STUDY N/A 10/09/2020   Procedure: INTRAVASCULAR PRESSURE WIRE/FFR STUDY;  Surgeon: Nigel Mormon, MD;  Location: [place] [facility];  Service: Cardiovascular;  Laterality: N/A;   LEFT HEART CATH AND CORONARY ANGIOGRAPHY N/A 10/09/2020   Procedure: LEFT HEART CATH AND CORONARY ANGIOGRAPHY;  Surgeon: Nigel Mormon, MD;  Location: [facility];  Service: Cardiovascular;  Laterality: N/A;    FAMILY HISTORY: The patient's family history includes Arthritis in her mother and paternal grandmother; Cancer in her maternal  grandmother; Colon cancer (age of onset: 24) in her mother; Heart disease in her father and paternal grandmother; Hypertension in her brother; Parkinson's disease in her father.   SOCIAL HISTORY:  The patient  reports that she has never smoked. She has never used smokeless tobacco. She reports current alcohol use. She reports that she does not use drugs.  Review of Systems  Constitutional: Negative for chills and fever.  HENT:  Negative for hoarse voice and nosebleeds.   Eyes:  Negative for discharge, double vision and pain.  Cardiovascular:  Positive for dyspnea on exertion (Improving). Negative for chest pain, claudication, leg swelling, near-syncope, orthopnea, palpitations, paroxysmal nocturnal dyspnea and syncope.  Respiratory:  Negative for hemoptysis and shortness of breath.   Musculoskeletal:  Negative for muscle  cramps and myalgias.  Gastrointestinal:  Negative for abdominal pain, constipation, diarrhea, hematemesis, hematochezia, melena, nausea and vomiting.  Neurological:  Negative for dizziness and light-headedness.    PHYSICAL EXAM:    05/09/2022   11:36 AM 05/06/2022    1:00 AM 05/06/2022   12:30 AM  Vitals with BMI  Height '5\' 5"'$     Weight 222 lbs 6 oz    BMI 60.73    Systolic [phone]  Diastolic 67 64 546  Pulse 90 78 78   CONSTITUTIONAL: Well-developed and well-nourished. No acute distress.  SKIN: Skin is warm and dry. No rash noted. No cyanosis. No pallor. No jaundice HEAD: Normocephalic and atraumatic.  EYES: No scleral icterus MOUTH/THROAT: Moist oral membranes.  NECK: No JVD present. No thyromegaly noted. No carotid bruits  LYMPHATIC: No visible cervical adenopathy.  CHEST Normal respiratory effort. No intercostal retractions  LUNGS: Clear to auscultation bilaterally. No stridor. No wheezes. No rales.  CARDIOVASCULAR: Regular rate and rhythm, positive S1-S2, no murmurs rubs or gallops appreciated. ABDOMINAL: Obese, soft, nontender, nondistended, positive  bowel sounds all 4 quadrants. No apparent ascites.  EXTREMITIES: No peripheral edema  HEMATOLOGIC: No significant bruising NEUROLOGIC: Oriented to person, place, and time. Nonfocal. Normal muscle tone.  PSYCHIATRIC: Normal mood and affect. Normal behavior. Cooperative Physical exam unchanged compared to previous office visit.  CARDIAC DATABASE: EKG: 08/30/2021: Normal sinus rhythm, 78 bpm, left bundle branch block, ST-T changes most likely secondary to LBBB.   05/12/2022: Sinus rhythm with borderline first-degree AV block at a rate of 83 bpm.  Left bundle branch block with likely secondary ST-T changes.  Compared EKG 08/30/2021, no significant change.  Echocardiogram: 10/08/2020 LVEF improved from 35-40% to 40-45% otherwise no significant change.  12/18/2020: Mildly depressed LV systolic function with visual EF 40-45%. Left ventricle cavity is normal in size. Abnormal septal wall motion due to left bundle branch block. Doppler evidence of grade I (impaired) diastolic dysfunction, normal LAP. Left ventricle regional wall motion findings: Mid anteroseptal, Mid inferoseptal and Apical septal hypokinesis. Trace aortic regurgitation. Mild (Grade I) mitral regurgitation. Compared to prior study dated   Stress Testing:  No results found for this or any previous visit from the past 1095 days.  Heart Catheterization: 10/09/2020  LM: Normal  LAD: Type 1 vessel that does not reach apex          Focal 50% LAD stenosis without involvement of adjacent diagonal branch  Ramus: Normal  LCx: Normal  RCA: Large, super-dominant, supplies apex. Normal vessel   LABORATORY DATA:    Latest Ref Rng & Units 05/05/2022    9:28 PM 10/09/2020    3:44 AM 10/07/2020    6:41 PM  CBC  WBC 4.0 - 10.5 K/uL 9.5  10.1  12.8   Hemoglobin 12.0 - 15.0 g/dL 13.3  12.3  13.1   Hematocrit 36.0 - 46.0 % 41.1  39.9  42.2   Platelets 150 - 400 K/uL 333  353  370        Latest Ref Rng & Units 05/05/2022    9:28 PM 03/14/2021     3:14 PM 10/08/2020    3:57 AM  CMP  Glucose 70 - 99 mg/dL 120  95  103   BUN 8 - 23 mg/dL '10  11  13   '$ Creatinine 0.44 - 1.00 mg/dL 1.00  0.84  0.93   Sodium 135 - 145 mmol/L 140  142  135   Potassium 3.5 - 5.1 mmol/L 4.5  4.3  3.7   Chloride 98 - 111 mmol/L 106  102  102   CO2 22 - 32 mmol/L '22  23  23   '$ Calcium 8.9 - 10.3 mg/dL 9.7  9.3  8.8     Lipid Panel     Component Value Date/Time   CHOL 168 10/08/2020 0357   TRIG 202 (H) 10/08/2020 0357   HDL 45 10/08/2020 0357   CHOLHDL 3.7 10/08/2020 0357   VLDL 40 10/08/2020 0357   LDLCALC 83 10/08/2020 0357   LDLDIRECT 90.6 12/04/2014 1358    Lab Results  Component Value Date   HGBA1C 5.7 01/16/2014   No components found for: "NTPROBNP" Lab Results  Component Value Date   TSH 0.79 01/16/2014    Cardiac Panel (last 3 results) No results for input(s): "CKTOTAL", "CKMB", "TROPONINIHS", "RELINDX" in the last 72 hours.   IMPRESSION:    ICD-10-CM   1. Nonobstructive atherosclerosis of coronary artery  I25.10     2. Ischemic cardiomyopathy  I25.5     3. Benign hypertension  I10 EKG 12-Lead       RECOMMENDATIONS: Mary Glass is a 66 y.o. female whose past medical history and cardiovascular risk factors include: Hypertension, coronary artery disease, recurrent heart failure, ischemic cardiomyopathy, hyperlipidemia, obesity.  Nonobstructive atherosclerosis of coronary artery Patient is tolerating addition of Ranexa without issue.  She has had no recurrence of angina since starting this.  We will continue Ranexa 500 mg p.o. twice daily. Continue aspirin, Plavix, rosuvastatin Continue Imdur  Ischemic cardiomyopathy No clinical evidence of acute heart failure at today's office visit. Continue Entresto, metoprolol, Imdur.  Benign hypertension Blood pressures are well controlled.  Continue present medications Reiterated the importance of low-sodium diet  Mixed hyperlipidemia Continue rosuvastatin which she is  tolerating without issue.  Will defer repeat lipid profile testing to PCP.   FINAL MEDICATION LIST END OF ENCOUNTER: Meds ordered this encounter  Medications   metoprolol succinate (TOPROL-XL) 25 MG 24 hr tablet    Sig: Take 1 tablet (25 mg total) by mouth daily.    Dispense:  90 tablet    Refill:  3   ranolazine (RANEXA) 500 MG 12 hr tablet    Sig: Take 1 tablet (500 mg total) by mouth 2 (two) times daily.    Dispense:  60 tablet    Refill:  3     Medications Discontinued During This Encounter  Medication Reason   metoprolol succinate (TOPROL-XL) 25 MG 24 hr tablet Reorder   ranolazine (RANEXA) 500 MG 12 hr tablet Reorder     Current Outpatient Medications:    amitriptyline (ELAVIL) 100 MG tablet, Take 100 mg by mouth at bedtime., Disp: , Rfl:    ASPIRIN LOW DOSE 81 MG EC tablet, TAKE ONE TABLET BY MOUTH DAILY; SWALLOW WHOLE AS DIRECTED BY YOUR DOCTOR, Disp: 30 tablet, Rfl: 11   BEE POLLEN PO, Take 1,000 mg by mouth daily., Disp: , Rfl:    buPROPion (WELLBUTRIN XL) 150 MG 24 hr tablet, Take 1 tablet (150 mg total) by mouth daily. PATIENT NEEDS OFFICE VISIT FOR ADDITIONAL REFILLS, Disp: 90 tablet, Rfl: 3   Calcium Carb-Cholecalciferol 500-125 MG-UNIT TABS, Take 1 tablet by mouth daily., Disp: , Rfl:    Cholecalciferol (VITAMIN D) 125 MCG (5000 UT) CAPS, Take 1 tablet by mouth daily., Disp: , Rfl:    clopidogrel (PLAVIX) 75 MG tablet, TAKE ONE TABLET BY MOUTH DAILY, Disp: 90 tablet, Rfl: 1   ENTRESTO 24-26 MG, TAKE ONE TABLET BY MOUTH  TWICE A DAY, Disp: 60 tablet, Rfl: 3   fluticasone (FLONASE) 50 MCG/ACT nasal spray, Place 2 sprays into both nostrils daily as needed for allergies or rhinitis., Disp: , Rfl:    Glucosamine-Chondroitin (MOVE FREE PO), Take 1 tablet by mouth daily., Disp: , Rfl:    hydrOXYzine (ATARAX/VISTARIL) 50 MG tablet, Take 50 mg by mouth 2 (two) times daily., Disp: , Rfl:    hydrOXYzine (ATARAX/VISTARIL) 50 MG tablet, Take 1 tablet by mouth as needed., Disp: ,  Rfl:    isosorbide mononitrate (IMDUR) 30 MG 24 hr tablet, TAKE ONE TABLET BY MOUTH DAILY, Disp: 90 tablet, Rfl: 1   KRILL OIL PO, Take 1 capsule by mouth daily. , Disp: , Rfl:    MAGNESIUM PO, Take 400 mg by mouth daily., Disp: , Rfl:    Multiple Vitamins-Minerals (OCUVITE ADULT 50+ PO), Take 1 tablet by mouth daily., Disp: , Rfl:    nitroGLYCERIN (NITROSTAT) 0.4 MG SL tablet, Place 1 tablet (0.4 mg total) under the tongue every 5 (five) minutes as needed for chest pain (Up to 3 doses in 15 minutes.)., Disp: 30 tablet, Rfl: 2   NON FORMULARY, Take by mouth daily. Morning Complete powder- kachavia, Disp: , Rfl:    NON FORMULARY, cymbotica, Disp: , Rfl:    Nutritional Supplements (KETO PO), Take 2 tablets by mouth daily. EXIPURE, Disp: , Rfl:    OVER THE COUNTER MEDICATION, Take 1 tablet by mouth daily. Garden of Life Probiotic for Women, Disp: , Rfl:    PARoxetine (PAXIL) 30 MG tablet, TAKE 1 TABLET (30 MG TOTAL) BY MOUTH DAILY. MUST SCHEDULE ANNUAL PHYSICAL FOR FURTHER REFILLS (Patient taking differently: Take 30 mg by mouth daily.), Disp: 30 tablet, Rfl: 0   RABEprazole (ACIPHEX) 20 MG tablet, Take 20 mg by mouth 2 (two) times daily., Disp: , Rfl:    rizatriptan (MAXALT) 10 MG tablet, Take 10 mg by mouth as needed for migraine. , Disp: , Rfl:    rosuvastatin (CRESTOR) 10 MG tablet, Take 1 tablet (10 mg total) by mouth at bedtime., Disp: 30 tablet, Rfl: 2   TURMERIC PO, Take 2,250 mg by mouth in the morning and at bedtime., Disp: , Rfl:    metoprolol succinate (TOPROL-XL) 25 MG 24 hr tablet, Take 1 tablet (25 mg total) by mouth daily., Disp: 90 tablet, Rfl: 3   ranolazine (RANEXA) 500 MG 12 hr tablet, Take 1 tablet (500 mg total) by mouth 2 (two) times daily., Disp: 60 tablet, Rfl: 3   traMADol (ULTRAM) 50 MG tablet, Take 1 tablet (50 mg total) by mouth every 6 (six) hours as needed., Disp: 15 tablet, Rfl: 0  Orders Placed This Encounter  Procedures   EKG 12-Lead   --Continue cardiac  medications as reconciled in final medication list. --Return in about 3 months (around 08/09/2022) for CAD, HFrEF . Or sooner if needed. --Continue follow-up with your primary care physician regarding the management of your other chronic comorbid conditions.  Patient's questions and concerns were addressed to her satisfaction. She voices understanding of the instructions provided during this encounter.   This note was created using a voice recognition software as a result there may be grammatical errors inadvertently enclosed that do not reflect the nature of this encounter. Every attempt is made to correct such errors.   Alethia Berthold, PA-C 05/12/2022, 9:55 AM Office: [phone]

## 2022-05-09 ENCOUNTER — Encounter: Payer: Self-pay | Admitting: Student

## 2022-05-09 ENCOUNTER — Ambulatory Visit: Payer: Medicare HMO | Admitting: Student

## 2022-05-09 VITALS — BP 118/67 | HR 90 | Temp 97.8°F | Resp 17 | Ht 65.0 in | Wt 222.4 lb

## 2022-05-09 DIAGNOSIS — I10 Essential (primary) hypertension: Secondary | ICD-10-CM

## 2022-05-09 DIAGNOSIS — I2510 Atherosclerotic heart disease of native coronary artery without angina pectoris: Secondary | ICD-10-CM

## 2022-05-09 DIAGNOSIS — I255 Ischemic cardiomyopathy: Secondary | ICD-10-CM

## 2022-05-09 MED ORDER — RANOLAZINE ER 500 MG PO TB12: 500.0000 mg | ORAL_TABLET | Freq: Two times a day (BID) | ORAL | 3 refills | Status: DC

## 2022-05-09 MED ORDER — METOPROLOL SUCCINATE ER 25 MG PO TB24: 25.0000 mg | ORAL_TABLET | Freq: Every day | ORAL | 3 refills | Status: DC

## 2022-06-29 ENCOUNTER — Other Ambulatory Visit: Payer: Self-pay | Admitting: Cardiology

## 2022-06-29 DIAGNOSIS — I255 Ischemic cardiomyopathy: Secondary | ICD-10-CM

## 2022-08-08 ENCOUNTER — Ambulatory Visit: Payer: Medicare HMO | Admitting: Student

## 2022-09-11 ENCOUNTER — Other Ambulatory Visit: Payer: Self-pay | Admitting: Cardiology

## 2022-09-11 DIAGNOSIS — I255 Ischemic cardiomyopathy: Secondary | ICD-10-CM

## 2022-09-11 DIAGNOSIS — I2510 Atherosclerotic heart disease of native coronary artery without angina pectoris: Secondary | ICD-10-CM

## 2022-09-22 ENCOUNTER — Ambulatory Visit: Payer: Medicare HMO | Admitting: Cardiology

## 2022-09-22 ENCOUNTER — Encounter: Payer: Self-pay | Admitting: Cardiology

## 2022-09-22 VITALS — BP 122/62 | HR 66 | Ht 65.0 in | Wt 220.2 lb

## 2022-09-22 DIAGNOSIS — E782 Mixed hyperlipidemia: Secondary | ICD-10-CM

## 2022-09-22 DIAGNOSIS — E781 Pure hyperglyceridemia: Secondary | ICD-10-CM

## 2022-09-22 DIAGNOSIS — I10 Essential (primary) hypertension: Secondary | ICD-10-CM

## 2022-09-22 DIAGNOSIS — I255 Ischemic cardiomyopathy: Secondary | ICD-10-CM

## 2022-09-22 DIAGNOSIS — I2510 Atherosclerotic heart disease of native coronary artery without angina pectoris: Secondary | ICD-10-CM

## 2022-09-22 MED ORDER — NITROGLYCERIN 0.4 MG SL SUBL: 0.4000 mg | SUBLINGUAL_TABLET | SUBLINGUAL | 2 refills | Status: DC | PRN

## 2022-09-22 NOTE — Progress Notes (Signed)
Mary Glass Date of Birth: [birth year] MRN: [id number] Primary Care Provider:Gordon, Cherylann Ratel, PA-C Primary Cardiologist: Rex Kras, DO, [facility]  (established care 10/08/2020)  Date: 09/22/22 Last Office Visit: 05/09/2022  Chief Complaint  Patient presents with   Coronary Artery Disease   HPI  Mary Glass is a 66 y.o.  female whose past medical history and cardiovascular risk factors include: Hypertension, coronary artery disease, chronic HFrEF, ischemic cardiomyopathy, hyperlipidemia, obesity.  Patient presented to the hospital in November 2021 for unstable angina.  Subsequent angiography noted 50% LAD disease and echocardiogram noted LVEF of 35-40%.  She was started on dual antiplatelet therapy therapy, beta-blockers.  In the past her antianginal therapies were uptitrated and now presents for 69-monthfollow-up visit.  Since last office visit patient has not been hospitalized for cardiovascular reasons.  She denies anginal discomfort or heart failure symptoms.  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  Her overall functional capacity remains relatively stable.  ALLERGIES: Allergies  Allergen Reactions   Hydrocodone-Acetaminophen     Other reaction(s): Other hyperactivity   Sumatriptan     Other reaction(s): Other Head pain    Ambien [Zolpidem Tartrate] Other (See Comments)    headaches   Cheese Other (See Comments)    Causes migraine   Codeine Other (See Comments)    hyperactivity   Benadryl [Diphenhydramine] Rash    Topical only, makes rash worse   Penicillins Nausea And Vomiting and Rash     MEDICATION LIST PRIOR TO VISIT: Current Outpatient Medications on File Prior to Visit  Medication Sig Dispense Refill   amitriptyline (ELAVIL) 100 MG tablet Take 100 mg by mouth at bedtime.     ASPIRIN LOW DOSE 81 MG EC tablet TAKE ONE TABLET BY MOUTH DAILY; SWALLOW WHOLE AS DIRECTED BY YOUR DOCTOR 30 tablet 11   BEE POLLEN PO Take  1,000 mg by mouth daily.     buPROPion (WELLBUTRIN XL) 150 MG 24 hr tablet Take 1 tablet (150 mg total) by mouth daily. PATIENT NEEDS OFFICE VISIT FOR ADDITIONAL REFILLS 90 tablet 3   Calcium Carb-Cholecalciferol 500-125 MG-UNIT TABS Take 1 tablet by mouth daily.     clopidogrel (PLAVIX) 75 MG tablet TAKE ONE TABLET BY MOUTH DAILY 90 tablet 1   ENTRESTO 24-26 MG TAKE ONE TABLET BY MOUTH TWICE A DAY 60 tablet 3   fluticasone (FLONASE) 50 MCG/ACT nasal spray Place 2 sprays into both nostrils daily as needed for allergies or rhinitis.     Glucosamine-Chondroitin (MOVE FREE PO) Take 1 tablet by mouth daily.     hydrOXYzine (ATARAX/VISTARIL) 50 MG tablet Take 50 mg by mouth at bedtime.     isosorbide mononitrate (IMDUR) 30 MG 24 hr tablet TAKE ONE TABLET BY MOUTH DAILY 90 tablet 0   KRILL OIL PO Take 1 capsule by mouth daily.      MAGNESIUM PO Take 400 mg by mouth daily.     metoprolol succinate (TOPROL-XL) 25 MG 24 hr tablet Take 1 tablet (25 mg total) by mouth daily. 90 tablet 3   Multiple Vitamins-Minerals (OCUVITE ADULT 50+ PO) Take 1 tablet by mouth daily.     NON FORMULARY Take by mouth daily. Morning Complete powder- kachavia     NON FORMULARY cymbotica     Nutritional Supplements (KETO PO) Take 2 tablets by mouth daily. EXIPURE     OVER THE COUNTER MEDICATION Take 1 tablet by mouth daily. Garden of Life Probiotic for Women  PARoxetine (PAXIL) 30 MG tablet TAKE 1 TABLET (30 MG TOTAL) BY MOUTH DAILY. MUST SCHEDULE ANNUAL PHYSICAL FOR FURTHER REFILLS (Patient taking differently: Take 30 mg by mouth daily.) 30 tablet 0   RABEprazole (ACIPHEX) 20 MG tablet Take 20 mg by mouth 2 (two) times daily.     ranolazine (RANEXA) 500 MG 12 hr tablet Take 1 tablet (500 mg total) by mouth 2 (two) times daily. 60 tablet 3   rizatriptan (MAXALT) 10 MG tablet Take 10 mg by mouth as needed for migraine.      rosuvastatin (CRESTOR) 10 MG tablet Take 1 tablet (10 mg total) by mouth at bedtime. 30 tablet 2    TURMERIC PO Take 2,250 mg by mouth in the morning and at bedtime.     No current facility-administered medications on file prior to visit.    PAST MEDICAL HISTORY: Past Medical History:  Diagnosis Date   Allergy    Anxiety    Arthritis    Coronary atherosclerosis of native coronary artery    Depression    GERD (gastroesophageal reflux disease)    Hyperlipidemia    LBBB (left bundle branch block)    Migraine     PAST SURGICAL HISTORY: Past Surgical History:  Procedure Laterality Date   ABDOMINAL HYSTERECTOMY  1995   FOOT SURGERY     INTRAVASCULAR PRESSURE WIRE/FFR STUDY N/A 10/09/2020   Procedure: INTRAVASCULAR PRESSURE WIRE/FFR STUDY;  Surgeon: Nigel Mormon, MD;  Location: [facility];  Service: Cardiovascular;  Laterality: N/A;   LEFT HEART CATH AND CORONARY ANGIOGRAPHY N/A 10/09/2020   Procedure: LEFT HEART CATH AND CORONARY ANGIOGRAPHY;  Surgeon: Nigel Mormon, MD;  Location: [facility];  Service: Cardiovascular;  Laterality: N/A;    FAMILY HISTORY: The patient's family history includes Arthritis in her mother and paternal grandmother; Cancer in her maternal grandmother; Colon cancer (age of onset: 17) in her mother; Heart disease in her father and paternal grandmother; Hypertension in her brother; Parkinson's disease in her father.   SOCIAL HISTORY:  The patient  reports that she has never smoked. She has never used smokeless tobacco. She reports current alcohol use. She reports that she does not use drugs.  Review of Systems  Cardiovascular:  Negative for chest pain, claudication, dyspnea on exertion, irregular heartbeat, leg swelling, near-syncope, orthopnea, palpitations, paroxysmal nocturnal dyspnea and syncope.  Respiratory:  Negative for shortness of breath.   Hematologic/Lymphatic: Negative for bleeding problem.  Musculoskeletal:  Negative for muscle cramps and myalgias.  Neurological:  Negative for dizziness and light-headedness.     PHYSICAL EXAM:    09/22/2022   11:23 AM 05/09/2022   11:36 AM 05/06/2022    1:00 AM  Vitals with BMI  Height '5\' 5"'$  '5\' 5"'$    Weight 220 lbs 3 oz 222 lbs 6 oz   BMI 48.18 56.31   Systolic 497 026 378  Diastolic 62 67 64  Pulse 66 90 78   Physical Exam  Constitutional: No distress.  Age appropriate, hemodynamically stable.   Neck: No JVD present.  Cardiovascular: Normal rate, regular rhythm, S1 normal, S2 normal, intact distal pulses and normal pulses. Exam reveals no gallop, no S3 and no S4.  No murmur heard. Pulmonary/Chest: Effort normal and breath sounds normal. No stridor. She has no wheezes. She has no rales.  Abdominal: Soft. Bowel sounds are normal. She exhibits no distension. There is no abdominal tenderness.  Musculoskeletal:        General: No edema.  Cervical back: Neck supple.  Neurological: She is alert and oriented to person, place, and time. She has intact cranial nerves (2-12).  Skin: Skin is warm and moist.   CARDIAC DATABASE: EKG: 08/30/2021: Normal sinus rhythm, 78 bpm, left bundle branch block, ST-T changes most likely secondary to LBBB.   05/12/2022: Sinus rhythm with borderline first-degree AV block at a rate of 83 bpm.  Left bundle branch block with likely secondary ST-T changes.  Compared EKG 08/30/2021, no significant change. 09/22/2022: Normal sinus rhythm, 67 bpm, left bundle branch block.  Echocardiogram: 10/08/2020 LVEF improved from 35-40% to 40-45% otherwise no significant change.  12/18/2020: Mildly depressed LV systolic function with visual EF 40-45%. Left ventricle cavity is normal in size. Abnormal septal wall motion due to left bundle branch block. Doppler evidence of grade I (impaired) diastolic dysfunction, normal LAP. Left ventricle regional wall motion findings: Mid anteroseptal, Mid inferoseptal and Apical septal hypokinesis. Trace aortic regurgitation. Mild (Grade I) mitral regurgitation. Compared to prior study dated   Stress Testing:   No results found for this or any previous visit from the past 1095 days.  Heart Catheterization: 10/09/2020  LM: Normal  LAD: Type 1 vessel that does not reach apex          Focal 50% LAD stenosis without involvement of adjacent diagonal branch  Ramus: Normal  LCx: Normal  RCA: Large, super-dominant, supplies apex. Normal vessel   LABORATORY DATA:    Latest Ref Rng & Units 05/05/2022    9:28 PM 10/09/2020    3:44 AM 10/07/2020    6:41 PM  CBC  WBC 4.0 - 10.5 K/uL 9.5  10.1  12.8   Hemoglobin 12.0 - 15.0 g/dL 13.3  12.3  13.1   Hematocrit 36.0 - 46.0 % 41.1  39.9  42.2   Platelets 150 - 400 K/uL 333  353  370        Latest Ref Rng & Units 05/05/2022    9:28 PM 03/14/2021    3:14 PM 10/08/2020    3:57 AM  CMP  Glucose 70 - 99 mg/dL 120  95  103   BUN 8 - 23 mg/dL '10  11  13   '$ Creatinine 0.44 - 1.00 mg/dL 1.00  0.84  0.93   Sodium 135 - 145 mmol/L 140  142  135   Potassium 3.5 - 5.1 mmol/L 4.5  4.3  3.7   Chloride 98 - 111 mmol/L 106  102  102   CO2 22 - 32 mmol/L '22  23  23   '$ Calcium 8.9 - 10.3 mg/dL 9.7  9.3  8.8     Lipid Panel  Lab Results  Component Value Date   CHOL 168 10/08/2020   HDL 45 10/08/2020   LDLCALC 83 10/08/2020   LDLDIRECT 90.6 12/04/2014   TRIG 202 (H) 10/08/2020   CHOLHDL 3.7 10/08/2020     Lab Results  Component Value Date   HGBA1C 5.7 01/16/2014   No components found for: "NTPROBNP" Lab Results  Component Value Date   TSH 0.79 01/16/2014    Cardiac Panel (last 3 results) No results for input(s): "CKTOTAL", "CKMB", "TROPONINIHS", "RELINDX" in the last 72 hours.  External Labs: Collected: 12/04/2021 TSH 2.1 Total cholesterol 177, triglycerides 395, HDL 38, LDL 76 Vitamin D 25.8 BUN 14, creatinine 0.84. Sodium 141, potassium 4.1, chloride 102, bicarb 23. AST 25, ALT 24, alkaline phosphatase 97  IMPRESSION:    ICD-10-CM   1. Nonobstructive atherosclerosis of coronary artery  I25.10  EKG 12-Lead    PCV ECHOCARDIOGRAM COMPLETE     nitroGLYCERIN (NITROSTAT) 0.4 MG SL tablet    2. Ischemic cardiomyopathy  I25.5     3. Benign hypertension  I10     4. Mixed hyperlipidemia  E78.2     5. Hypertriglyceridemia  E78.1        RECOMMENDATIONS: Davy Faught is a 66 y.o. female whose past medical history and cardiovascular risk factors include: Hypertension, coronary artery disease, recurrent heart failure, ischemic cardiomyopathy, hyperlipidemia, obesity.  Nonobstructive atherosclerosis of coronary artery Based on angiography back in 2021. Since then has been started on medical therapy. LDL is within acceptable limits as of January 2023. Triglycerides are not well controlled. Patient has remained anginal free on her current antianginal regimen.  Ischemic cardiomyopathy Improving. Continue current GDMT. Repeat echocardiogram prior to the next office visit to reevaluate LVEF.  Benign hypertension Office blood pressures are well controlled. No medication changes warranted at this time.  Mixed hyperlipidemia Currently on Crestor.   She denies myalgia or other side effects. Most recent lipids dated January 2023, independently reviewed as noted above. Currently managed by primary care provider.  Hypertriglyceridemia As of January 2023 her triglycerides are still not well controlled. Patient has an appointment to see PCP later today.  I have asked her to repeat fasting lipid profile and if the triglycerides are still not well controlled consider pharmacological therapy.  We will defer management to PCP for now.  FINAL MEDICATION LIST END OF ENCOUNTER: Meds ordered this encounter  Medications   nitroGLYCERIN (NITROSTAT) 0.4 MG SL tablet    Sig: Place 1 tablet (0.4 mg total) under the tongue every 5 (five) minutes as needed for chest pain (Up to 3 doses in 15 minutes.).    Dispense:  30 tablet    Refill:  2     Medications Discontinued During This Encounter  Medication Reason   Cholecalciferol (VITAMIN  D) 125 MCG (7846 UT) CAPS Duplicate   hydrOXYzine (ATARAX/VISTARIL) 50 MG tablet Duplicate   traMADol (ULTRAM) 50 MG tablet Completed Course   nitroGLYCERIN (NITROSTAT) 0.4 MG SL tablet Reorder     Current Outpatient Medications:    amitriptyline (ELAVIL) 100 MG tablet, Take 100 mg by mouth at bedtime., Disp: , Rfl:    ASPIRIN LOW DOSE 81 MG EC tablet, TAKE ONE TABLET BY MOUTH DAILY; SWALLOW WHOLE AS DIRECTED BY YOUR DOCTOR, Disp: 30 tablet, Rfl: 11   BEE POLLEN PO, Take 1,000 mg by mouth daily., Disp: , Rfl:    buPROPion (WELLBUTRIN XL) 150 MG 24 hr tablet, Take 1 tablet (150 mg total) by mouth daily. PATIENT NEEDS OFFICE VISIT FOR ADDITIONAL REFILLS, Disp: 90 tablet, Rfl: 3   Calcium Carb-Cholecalciferol 500-125 MG-UNIT TABS, Take 1 tablet by mouth daily., Disp: , Rfl:    clopidogrel (PLAVIX) 75 MG tablet, TAKE ONE TABLET BY MOUTH DAILY, Disp: 90 tablet, Rfl: 1   ENTRESTO 24-26 MG, TAKE ONE TABLET BY MOUTH TWICE A DAY, Disp: 60 tablet, Rfl: 3   fluticasone (FLONASE) 50 MCG/ACT nasal spray, Place 2 sprays into both nostrils daily as needed for allergies or rhinitis., Disp: , Rfl:    Glucosamine-Chondroitin (MOVE FREE PO), Take 1 tablet by mouth daily., Disp: , Rfl:    hydrOXYzine (ATARAX/VISTARIL) 50 MG tablet, Take 50 mg by mouth at bedtime., Disp: , Rfl:    isosorbide mononitrate (IMDUR) 30 MG 24 hr tablet, TAKE ONE TABLET BY MOUTH DAILY, Disp: 90 tablet, Rfl: 0   KRILL OIL PO,  Take 1 capsule by mouth daily. , Disp: , Rfl:    MAGNESIUM PO, Take 400 mg by mouth daily., Disp: , Rfl:    metoprolol succinate (TOPROL-XL) 25 MG 24 hr tablet, Take 1 tablet (25 mg total) by mouth daily., Disp: 90 tablet, Rfl: 3   Multiple Vitamins-Minerals (OCUVITE ADULT 50+ PO), Take 1 tablet by mouth daily., Disp: , Rfl:    NON FORMULARY, Take by mouth daily. Morning Complete powder- kachavia, Disp: , Rfl:    NON FORMULARY, cymbotica, Disp: , Rfl:    Nutritional Supplements (KETO PO), Take 2 tablets by mouth  daily. EXIPURE, Disp: , Rfl:    OVER THE COUNTER MEDICATION, Take 1 tablet by mouth daily. Garden of Life Probiotic for Women, Disp: , Rfl:    PARoxetine (PAXIL) 30 MG tablet, TAKE 1 TABLET (30 MG TOTAL) BY MOUTH DAILY. MUST SCHEDULE ANNUAL PHYSICAL FOR FURTHER REFILLS (Patient taking differently: Take 30 mg by mouth daily.), Disp: 30 tablet, Rfl: 0   RABEprazole (ACIPHEX) 20 MG tablet, Take 20 mg by mouth 2 (two) times daily., Disp: , Rfl:    ranolazine (RANEXA) 500 MG 12 hr tablet, Take 1 tablet (500 mg total) by mouth 2 (two) times daily., Disp: 60 tablet, Rfl: 3   rizatriptan (MAXALT) 10 MG tablet, Take 10 mg by mouth as needed for migraine. , Disp: , Rfl:    rosuvastatin (CRESTOR) 10 MG tablet, Take 1 tablet (10 mg total) by mouth at bedtime., Disp: 30 tablet, Rfl: 2   TURMERIC PO, Take 2,250 mg by mouth in the morning and at bedtime., Disp: , Rfl:    nitroGLYCERIN (NITROSTAT) 0.4 MG SL tablet, Place 1 tablet (0.4 mg total) under the tongue every 5 (five) minutes as needed for chest pain (Up to 3 doses in 15 minutes.)., Disp: 30 tablet, Rfl: 2  Orders Placed This Encounter  Procedures   EKG 12-Lead   PCV ECHOCARDIOGRAM COMPLETE   --Continue cardiac medications as reconciled in final medication list. --Return in about 6 months (around 03/25/2023) for Follow up, CAD,HfREF. Or sooner if needed. --Continue follow-up with your primary care physician regarding the management of your other chronic comorbid conditions.  Patient's questions and concerns were addressed to her satisfaction. She voices understanding of the instructions provided during this encounter.   This note was created using a voice recognition software as a result there may be grammatical errors inadvertently enclosed that do not reflect the nature of this encounter. Every attempt is made to correct such errors.  Rex Kras, [place], [facility]  Pager: [phone] Office: [phone]

## 2022-10-01 ENCOUNTER — Other Ambulatory Visit: Payer: Self-pay | Admitting: Cardiology

## 2022-10-01 DIAGNOSIS — I255 Ischemic cardiomyopathy: Secondary | ICD-10-CM

## 2022-10-14 ENCOUNTER — Other Ambulatory Visit: Payer: Self-pay | Admitting: Cardiology

## 2022-12-08 ENCOUNTER — Other Ambulatory Visit: Payer: Self-pay

## 2022-12-08 MED ORDER — ASPIRIN 81 MG PO TBEC: DELAYED_RELEASE_TABLET | ORAL | 11 refills | Status: DC

## 2022-12-11 ENCOUNTER — Other Ambulatory Visit: Payer: Self-pay | Admitting: Cardiology

## 2022-12-11 DIAGNOSIS — I255 Ischemic cardiomyopathy: Secondary | ICD-10-CM

## 2022-12-11 DIAGNOSIS — I2510 Atherosclerotic heart disease of native coronary artery without angina pectoris: Secondary | ICD-10-CM

## 2023-03-09 ENCOUNTER — Other Ambulatory Visit: Payer: Medicare HMO

## 2023-03-11 ENCOUNTER — Other Ambulatory Visit: Payer: Self-pay | Admitting: Cardiology

## 2023-03-11 DIAGNOSIS — I2510 Atherosclerotic heart disease of native coronary artery without angina pectoris: Secondary | ICD-10-CM

## 2023-03-11 DIAGNOSIS — I255 Ischemic cardiomyopathy: Secondary | ICD-10-CM

## 2023-03-26 ENCOUNTER — Ambulatory Visit: Payer: Medicare HMO | Admitting: Cardiology

## 2023-03-31 ENCOUNTER — Other Ambulatory Visit: Payer: Self-pay | Admitting: Cardiology

## 2023-03-31 DIAGNOSIS — I255 Ischemic cardiomyopathy: Secondary | ICD-10-CM

## 2023-04-06 ENCOUNTER — Ambulatory Visit: Payer: Medicare HMO

## 2023-04-06 DIAGNOSIS — I2510 Atherosclerotic heart disease of native coronary artery without angina pectoris: Secondary | ICD-10-CM

## 2023-04-13 NOTE — Progress Notes (Signed)
LMTCB

## 2023-04-16 NOTE — Progress Notes (Signed)
Called patient no answer left a vm

## 2023-04-21 NOTE — Progress Notes (Signed)
Called patient no answer left a vm

## 2023-04-28 ENCOUNTER — Ambulatory Visit: Payer: Medicare HMO | Admitting: Cardiology

## 2023-05-04 ENCOUNTER — Ambulatory Visit: Payer: Medicare HMO | Admitting: Cardiology

## 2023-05-05 ENCOUNTER — Ambulatory Visit: Payer: Medicare HMO | Admitting: Cardiology

## 2023-05-05 ENCOUNTER — Encounter: Payer: Self-pay | Admitting: Cardiology

## 2023-05-05 VITALS — BP 117/56 | HR 74 | Ht 65.0 in | Wt 217.8 lb

## 2023-05-05 DIAGNOSIS — I5032 Chronic diastolic (congestive) heart failure: Secondary | ICD-10-CM

## 2023-05-05 DIAGNOSIS — I2510 Atherosclerotic heart disease of native coronary artery without angina pectoris: Secondary | ICD-10-CM

## 2023-05-05 DIAGNOSIS — E782 Mixed hyperlipidemia: Secondary | ICD-10-CM

## 2023-05-05 DIAGNOSIS — I10 Essential (primary) hypertension: Secondary | ICD-10-CM

## 2023-05-05 DIAGNOSIS — E781 Pure hyperglyceridemia: Secondary | ICD-10-CM

## 2023-05-05 MED ORDER — FENOFIBRATE 145 MG PO TABS: 145.0000 mg | ORAL_TABLET | Freq: Every day | ORAL | 0 refills | Status: DC

## 2023-05-05 MED ORDER — ROSUVASTATIN CALCIUM 20 MG PO TABS: 20.0000 mg | ORAL_TABLET | Freq: Every day | ORAL | 0 refills | Status: DC

## 2023-05-05 NOTE — Progress Notes (Signed)
[name] Date of Birth: 01-19-1956 MRN: [id number] Primary Care Provider:Gordon, Gaye Alken, PA-C Primary Cardiologist: Tessa Lerner, DO, [facility]  (established care 10/08/2020)  Date: 05/05/23 Last Office Visit: September 22, 2022.  Chief Complaint  Patient presents with   Coronary Artery Disease   Follow-up   Results   HPI  Mary Glass is a 67 y.o.  female whose past medical history and cardiovascular risk factors include: Hypertension, coronary artery disease, LBBB, heart failure with improved EF, chronic HFpEF, hyperlipidemia, obesity.  Patient presented to the hospital in November 2021 for unstable angina.  Subsequent angiography noted 50% LAD disease and echocardiogram noted LVEF of 35-40%.  Since last office visit she denies anginal chest pain or heart failure symptoms.  She is tolerating the current medical therapy well.  No use of sublingual nitroglycerin tablets.   She had labs with PCP in December 2023 independently reviewed in Care Everywhere noted below.  She had an echocardiogram prior to today's office visit results were reviewed with the patient and noted below for further reference.   ALLERGIES: Allergies  Allergen Reactions   Hydrocodone-Acetaminophen     Other reaction(s): Other hyperactivity   Sumatriptan     Other reaction(s): Other Head pain    Ambien [Zolpidem Tartrate] Other (See Comments)    headaches   Cheese Other (See Comments)    Causes migraine   Codeine Other (See Comments)    hyperactivity   Benadryl [Diphenhydramine] Rash    Topical only, makes rash worse   Penicillins Nausea And Vomiting and Rash     MEDICATION LIST PRIOR TO VISIT: Current Outpatient Medications on File Prior to Visit  Medication Sig Dispense Refill   amitriptyline (ELAVIL) 100 MG tablet Take 100 mg by mouth at bedtime.     aspirin EC (ASPIRIN LOW DOSE) 81 MG tablet TAKE ONE TABLET BY MOUTH DAILY; SWALLOW WHOLE AS DIRECTED BY YOUR DOCTOR 30 tablet 11   Calcium  Carb-Cholecalciferol 500-125 MG-UNIT TABS Take 1 tablet by mouth daily.     clopidogrel (PLAVIX) 75 MG tablet TAKE 1 TABLET BY MOUTH DAILY 90 tablet 1   fluticasone (FLONASE) 50 MCG/ACT nasal spray Place 2 sprays into both nostrils daily as needed for allergies or rhinitis.     Glucosamine-Chondroitin (MOVE FREE PO) Take 1 tablet by mouth daily.     hydrOXYzine (ATARAX/VISTARIL) 50 MG tablet Take 50 mg by mouth at bedtime.     isosorbide mononitrate (IMDUR) 30 MG 24 hr tablet TAKE 1 TABLET BY MOUTH DAILY. MUST CALL PRESCRIBER FOR APPOINTMENT FOR FUTURE REFILLS. 90 tablet 0   KRILL OIL PO Take 1 capsule by mouth daily.      MAGNESIUM PO Take 400 mg by mouth daily.     metoprolol succinate (TOPROL-XL) 25 MG 24 hr tablet Take 1 tablet (25 mg total) by mouth daily. 90 tablet 3   Multiple Vitamins-Minerals (OCUVITE ADULT 50+ PO) Take 1 tablet by mouth daily.     nitroGLYCERIN (NITROSTAT) 0.4 MG SL tablet Place 1 tablet (0.4 mg total) under the tongue every 5 (five) minutes as needed for chest pain (Up to 3 doses in 15 minutes.). 30 tablet 2   NON FORMULARY Take by mouth daily. Morning Complete powder- kachavia     NON FORMULARY cymbotica     Nutritional Supplements (KETO PO) Take 2 tablets by mouth daily. EXIPURE     OVER THE COUNTER MEDICATION Take 1 tablet by mouth daily. Garden of Life Probiotic for Women  PARoxetine (PAXIL) 30 MG tablet TAKE 1 TABLET (30 MG TOTAL) BY MOUTH DAILY. MUST SCHEDULE ANNUAL PHYSICAL FOR FURTHER REFILLS (Patient taking differently: Take 30 mg by mouth daily.) 30 tablet 0   RABEprazole (ACIPHEX) 20 MG tablet Take 20 mg by mouth 2 (two) times daily.     ranolazine (RANEXA) 500 MG 12 hr tablet Take 1 tablet (500 mg total) by mouth 2 (two) times daily. 60 tablet 3   rizatriptan (MAXALT) 10 MG tablet Take 10 mg by mouth as needed for migraine.      sacubitril-valsartan (ENTRESTO) 24-26 MG TAKE 1 TABLET BY MOUTH TWICE A DAY 180 tablet 1   TURMERIC PO Take 2,250 mg by mouth  in the morning and at bedtime.     buPROPion (WELLBUTRIN XL) 150 MG 24 hr tablet Take 1 tablet (150 mg total) by mouth daily. PATIENT NEEDS OFFICE VISIT FOR ADDITIONAL REFILLS (Patient not taking: Reported on 05/05/2023) 90 tablet 3   No current facility-administered medications on file prior to visit.    PAST MEDICAL HISTORY: Past Medical History:  Diagnosis Date   Allergy    Anxiety    Arthritis    Coronary atherosclerosis of native coronary artery    Depression    GERD (gastroesophageal reflux disease)    Hyperlipidemia    LBBB (left bundle branch block)    Migraine     PAST SURGICAL HISTORY: Past Surgical History:  Procedure Laterality Date   ABDOMINAL HYSTERECTOMY  1995   CORONARY PRESSURE/FFR STUDY N/A 10/09/2020   Procedure: INTRAVASCULAR PRESSURE WIRE/FFR STUDY;  Surgeon: Elder Negus, MD;  Location: [facility];  Service: Cardiovascular;  Laterality: N/A;   FOOT SURGERY     LEFT HEART CATH AND CORONARY ANGIOGRAPHY N/A 10/09/2020   Procedure: LEFT HEART CATH AND CORONARY ANGIOGRAPHY;  Surgeon: Elder Negus, MD;  Location: [facility];  Service: Cardiovascular;  Laterality: N/A;    FAMILY HISTORY: The patient's family history includes Arthritis in her mother and paternal grandmother; Cancer in her maternal grandmother; Colon cancer (age of onset: 31) in her mother; Heart disease in her father and paternal grandmother; Hypertension in her brother; Parkinson's disease in her father.   SOCIAL HISTORY:  The patient  reports that she has never smoked. She has never used smokeless tobacco. She reports current alcohol use. She reports that she does not use drugs.  Review of Systems  Cardiovascular:  Negative for chest pain, claudication, dyspnea on exertion, irregular heartbeat, leg swelling, near-syncope, orthopnea, palpitations, paroxysmal nocturnal dyspnea and syncope.  Respiratory:  Negative for shortness of breath.   Hematologic/Lymphatic:  Negative for bleeding problem.  Musculoskeletal:  Negative for muscle cramps and myalgias.  Neurological:  Negative for dizziness and light-headedness.    PHYSICAL EXAM:    05/05/2023    2:16 PM 09/22/2022   11:23 AM 05/09/2022   11:36 AM  Vitals with BMI  Height 5\' 5"  5\' 5"  5\' 5"   Weight 217 lbs 13 oz 220 lbs 3 oz 222 lbs 6 oz  BMI 36.24 36.64 37.01  Systolic 117 122 161  Diastolic 56 62 67  Pulse 74 66 90   Physical Exam  Constitutional: No distress.  Age appropriate, hemodynamically stable.   Neck: No JVD present.  Cardiovascular: Normal rate, regular rhythm, S1 normal, S2 normal, intact distal pulses and normal pulses. Exam reveals no gallop, no S3 and no S4.  No murmur heard. Pulmonary/Chest: Effort normal and breath sounds normal. No stridor. She has no  wheezes. She has no rales.  Abdominal: Soft. Bowel sounds are normal. She exhibits no distension. There is no abdominal tenderness.  Musculoskeletal:        General: No edema.     Cervical back: Neck supple.  Neurological: She is alert and oriented to person, place, and time. She has intact cranial nerves (2-12).  Skin: Skin is warm and moist.   CARDIAC DATABASE: EKG: May 05, 2023: Normal sinus rhythm, 75 bpm, left bundle branch block, nonspecific abnormality.  Echocardiogram: 10/08/2020 LVEF improved from 35-40% to 40-45% otherwise no significant change.  12/18/2020: Mildly depressed LV systolic function with visual EF 40-45%. Left ventricle cavity is normal in size. Abnormal septal wall motion due to left bundle branch block. Doppler evidence of grade I (impaired) diastolic dysfunction, normal LAP. Left ventricle regional wall motion findings: Mid anteroseptal, Mid inferoseptal and Apical septal hypokinesis. Trace aortic regurgitation. Mild (Grade I) mitral regurgitation.   04/06/2023: Low-normal LV systolic function with visual EF 50-55%. Left ventricle cavity is normal in size. Mid anteroseptal, Mid inferoseptal and  Apical septal hypokinesis. Doppler evidence of grade I (impaired) diastolic dysfunction, normal LAP. Calculated EF 51%. Structurally normal tricuspid valve with trace regurgitation. No evidence of pulmonary hypertension. Compared to 11/2020, LVEF has slightly improved from 45% to know 51%.   Stress Testing:  No results found for this or any previous visit from the past 1095 days.  Heart Catheterization: 10/09/2020  LM: Normal  LAD: Type 1 vessel that does not reach apex          Focal 50% LAD stenosis without involvement of adjacent diagonal branch  Ramus: Normal  LCx: Normal  RCA: Large, super-dominant, supplies apex. Normal vessel   LABORATORY DATA: External Labs: Collected: 12/04/2021 TSH 2.1 Total cholesterol 177, triglycerides 395, HDL 38, LDL 76 Vitamin D 25.8 BUN 14, creatinine 0.84. Sodium 141, potassium 4.1, chloride 102, bicarb 23. AST 25, ALT 24, alkaline phosphatase 97  External Labs: Collected: November 05 2022 available in Care Everywhere. Total cholesterol 163, triglycerides 223, HDL 36, LDL 89. Hemoglobin 14.2, hematocrit 41%. TSH 1.37  IMPRESSION:    ICD-10-CM   1. Nonobstructive atherosclerosis of coronary artery  I25.10 EKG 12-Lead    2. Heart failure with improved ejection fraction (HFimpEF) (HCC)  I50.32     3. Chronic heart failure with preserved ejection fraction (HFpEF) (HCC)  I50.32     4. Benign hypertension  I10     5. Mixed hyperlipidemia  E78.2 rosuvastatin (CRESTOR) 20 MG tablet    fenofibrate (TRICOR) 145 MG tablet    Lipid Panel With LDL/HDL Ratio    LDL cholesterol, direct    CMP14+EGFR    6. Hypertriglyceridemia  E78.1 rosuvastatin (CRESTOR) 20 MG tablet    fenofibrate (TRICOR) 145 MG tablet    Lipid Panel With LDL/HDL Ratio    LDL cholesterol, direct    CMP14+EGFR       RECOMMENDATIONS: Mary Glass is a 67 y.o. female whose past medical history and cardiovascular risk factors include: Hypertension, coronary artery  disease, LBBB, heart failure with improved EF, chronic HFpEF, hyperlipidemia, obesity.  Nonobstructive atherosclerosis of coronary artery Noted on angiography back in 2021. Has been on antiplatelet therapy. No use of sublingual nitroglycerin tablets. EKG continues to note sinus rhythm with left bundle branch block. Most recent echocardiogram results reviewed and noted above. Reemphasized importance of improving her modifiable cardiovascular risk factors. Patient's triglyceride levels continue to be elevated despite lifestyle changes.   Increase rosuvastatin to 20 mg p.o.  nightly Start fenofibrate 145 mg p.o. daily with labs in 6 weeks to reevaluate therapy.  Heart failure with improved ejection fraction (HFimpEF) (HCC) Chronic heart failure with preserved ejection fraction (HFpEF) (HCC) Prior LVEF 35-40%. January 2022: 40-45%. May 2024 50-55% with grade 1 diastolic dysfunction, see report for additional details. Medications reconciled. Strict I's and O's, daily weights. Reemphasized the importance of improving her modifiable cardiovascular risk factors.  Benign hypertension Office blood pressures are at goal.  No changes warranted.  Mixed hyperlipidemia Hypertriglyceridemia Currently on rosuvastatin.   She denies myalgia or other side effects. Most recent lipids dated December 2023, independently reviewed as noted above. Increase rosuvastatin and add fenofibrate as discussed above  FINAL MEDICATION LIST END OF ENCOUNTER: Meds ordered this encounter  Medications   rosuvastatin (CRESTOR) 20 MG tablet    Sig: Take 1 tablet (20 mg total) by mouth at bedtime.    Dispense:  90 tablet    Refill:  0   fenofibrate (TRICOR) 145 MG tablet    Sig: Take 1 tablet (145 mg total) by mouth daily.    Dispense:  90 tablet    Refill:  0     Medications Discontinued During This Encounter  Medication Reason   BEE POLLEN PO Patient Preference   rosuvastatin (CRESTOR) 10 MG tablet Reorder      Current Outpatient Medications:    amitriptyline (ELAVIL) 100 MG tablet, Take 100 mg by mouth at bedtime., Disp: , Rfl:    aspirin EC (ASPIRIN LOW DOSE) 81 MG tablet, TAKE ONE TABLET BY MOUTH DAILY; SWALLOW WHOLE AS DIRECTED BY YOUR DOCTOR, Disp: 30 tablet, Rfl: 11   Calcium Carb-Cholecalciferol 500-125 MG-UNIT TABS, Take 1 tablet by mouth daily., Disp: , Rfl:    clopidogrel (PLAVIX) 75 MG tablet, TAKE 1 TABLET BY MOUTH DAILY, Disp: 90 tablet, Rfl: 1   fenofibrate (TRICOR) 145 MG tablet, Take 1 tablet (145 mg total) by mouth daily., Disp: 90 tablet, Rfl: 0   fluticasone (FLONASE) 50 MCG/ACT nasal spray, Place 2 sprays into both nostrils daily as needed for allergies or rhinitis., Disp: , Rfl:    Glucosamine-Chondroitin (MOVE FREE PO), Take 1 tablet by mouth daily., Disp: , Rfl:    hydrOXYzine (ATARAX/VISTARIL) 50 MG tablet, Take 50 mg by mouth at bedtime., Disp: , Rfl:    isosorbide mononitrate (IMDUR) 30 MG 24 hr tablet, TAKE 1 TABLET BY MOUTH DAILY. MUST CALL PRESCRIBER FOR APPOINTMENT FOR FUTURE REFILLS., Disp: 90 tablet, Rfl: 0   KRILL OIL PO, Take 1 capsule by mouth daily. , Disp: , Rfl:    MAGNESIUM PO, Take 400 mg by mouth daily., Disp: , Rfl:    metoprolol succinate (TOPROL-XL) 25 MG 24 hr tablet, Take 1 tablet (25 mg total) by mouth daily., Disp: 90 tablet, Rfl: 3   Multiple Vitamins-Minerals (OCUVITE ADULT 50+ PO), Take 1 tablet by mouth daily., Disp: , Rfl:    nitroGLYCERIN (NITROSTAT) 0.4 MG SL tablet, Place 1 tablet (0.4 mg total) under the tongue every 5 (five) minutes as needed for chest pain (Up to 3 doses in 15 minutes.)., Disp: 30 tablet, Rfl: 2   NON FORMULARY, Take by mouth daily. Morning Complete powder- kachavia, Disp: , Rfl:    NON FORMULARY, cymbotica, Disp: , Rfl:    Nutritional Supplements (KETO PO), Take 2 tablets by mouth daily. EXIPURE, Disp: , Rfl:    OVER THE COUNTER MEDICATION, Take 1 tablet by mouth daily. Garden of Life Probiotic for Women, Disp: , Rfl:  PARoxetine (PAXIL) 30 MG tablet, TAKE 1 TABLET (30 MG TOTAL) BY MOUTH DAILY. MUST SCHEDULE ANNUAL PHYSICAL FOR FURTHER REFILLS (Patient taking differently: Take 30 mg by mouth daily.), Disp: 30 tablet, Rfl: 0   RABEprazole (ACIPHEX) 20 MG tablet, Take 20 mg by mouth 2 (two) times daily., Disp: , Rfl:    ranolazine (RANEXA) 500 MG 12 hr tablet, Take 1 tablet (500 mg total) by mouth 2 (two) times daily., Disp: 60 tablet, Rfl: 3   rizatriptan (MAXALT) 10 MG tablet, Take 10 mg by mouth as needed for migraine. , Disp: , Rfl:    sacubitril-valsartan (ENTRESTO) 24-26 MG, TAKE 1 TABLET BY MOUTH TWICE A DAY, Disp: 180 tablet, Rfl: 1   TURMERIC PO, Take 2,250 mg by mouth in the morning and at bedtime., Disp: , Rfl:    buPROPion (WELLBUTRIN XL) 150 MG 24 hr tablet, Take 1 tablet (150 mg total) by mouth daily. PATIENT NEEDS OFFICE VISIT FOR ADDITIONAL REFILLS (Patient not taking: Reported on 05/05/2023), Disp: 90 tablet, Rfl: 3   rosuvastatin (CRESTOR) 20 MG tablet, Take 1 tablet (20 mg total) by mouth at bedtime., Disp: 90 tablet, Rfl: 0  Orders Placed This Encounter  Procedures   Lipid Panel With LDL/HDL Ratio   LDL cholesterol, direct   [id number]   EKG 12-Lead   --Continue cardiac medications as reconciled in final medication list. --Return in about 6 months (around 11/04/2023) for Follow up HFimpEF, chronic HFpeF, lipids. Or sooner if needed. --Continue follow-up with your primary care physician regarding the management of your other chronic comorbid conditions.  Patient's questions and concerns were addressed to her satisfaction. She voices understanding of the instructions provided during this encounter.   This note was created using a voice recognition software as a result there may be grammatical errors inadvertently enclosed that do not reflect the nature of this encounter. Every attempt is made to correct such errors.  Tessa Lerner, [place], [facility]  Pager:  [phone] Office: [phone]

## 2023-05-08 ENCOUNTER — Encounter (HOSPITAL_COMMUNITY): Payer: Self-pay

## 2023-05-08 ENCOUNTER — Ambulatory Visit (HOSPITAL_COMMUNITY)
Admission: EM | Admit: 2023-05-08 | Discharge: 2023-05-08 | Disposition: A | Discharge status: Home / Self Care | Payer: Medicare HMO | Attending: Emergency Medicine | Admitting: Emergency Medicine

## 2023-05-08 DIAGNOSIS — S61452A Open bite of left hand, initial encounter: Secondary | ICD-10-CM

## 2023-05-08 DIAGNOSIS — W540XXA Bitten by dog, initial encounter: Secondary | ICD-10-CM

## 2023-05-08 MED ORDER — AMOXICILLIN-POT CLAVULANATE 875-125 MG PO TABS: 1.0000 | ORAL_TABLET | Freq: Two times a day (BID) | ORAL | 0 refills | Status: DC

## 2023-05-08 MED ORDER — KETOROLAC TROMETHAMINE 30 MG/ML IJ SOLN
INTRAMUSCULAR | Status: AC
  Filled 2023-05-08: qty 1

## 2023-05-08 MED ORDER — FLUCONAZOLE 150 MG PO TABS: ORAL_TABLET | ORAL | 0 refills | Status: DC

## 2023-05-08 MED ORDER — KETOROLAC TROMETHAMINE 30 MG/ML IJ SOLN
30.0000 mg | Freq: Once | INTRAMUSCULAR | Status: AC
  Administered 2023-05-08: 30 mg via INTRAMUSCULAR

## 2023-05-08 NOTE — ED Triage Notes (Signed)
[facility] to [facility] with [facility] of dog bite to left hand about 2;15 pm Pt reports her two large dogs got into a fight and she was trying to sperate them and she got

## 2023-05-08 NOTE — ED Provider Notes (Signed)
[facility]    CSN: [id number] Arrival date & time: 05/08/23  1948      History   Chief Complaint Chief Complaint  Patient presents with   Animal Bite    HPI Mary Glass is a 67 y.o. female.   Patient presents to clinic over complaints of a dog bite to her left hand.  She was attempting to separate her dogs from fighting, when her 1 dog bit her in her left hand.  The dog ended up knocking her down, and now she reports she is sore all over.  She did hit her right breast and does have some bruising.  Left hand with 3 puncture wounds.  Reports dogs are up-to-date on all vaccines.      The history is provided by the patient and medical records.  Animal Bite   Past Medical History:  Diagnosis Date   Allergy    Anxiety    Arthritis    Coronary atherosclerosis of native coronary artery    Depression    GERD (gastroesophageal reflux disease)    Hyperlipidemia    LBBB (left bundle branch block)    Migraine     Patient Active Problem List   Diagnosis Date Noted   Unstable angina (HCC)    Chest pain 10/07/2020   HLD (hyperlipidemia) 05/18/2014   Toenail fungus 02/15/2014   Obesity (BMI 30-39.9) 01/16/2014   Insomnia 01/16/2014   Hot flashes 01/16/2014   Migraine 12/27/2012   Depression 12/27/2012   GERD (gastroesophageal reflux disease) 12/27/2012    Past Surgical History:  Procedure Laterality Date   ABDOMINAL HYSTERECTOMY  1995   CORONARY PRESSURE/FFR STUDY N/A 10/09/2020   Procedure: INTRAVASCULAR PRESSURE WIRE/FFR STUDY;  Surgeon: Elder Negus, MD;  [facility];  Service: Cardiovascular;  Laterality: N/A;   FOOT SURGERY     LEFT HEART CATH AND CORONARY ANGIOGRAPHY N/A 10/09/2020   Procedure: LEFT HEART CATH AND CORONARY ANGIOGRAPHY;  Surgeon: Elder Negus, MD;  [facility];  Service: Cardiovascular;  Laterality: N/A;    OB History   No obstetric history on file.      Home Medications     Prior to Admission medications   Medication Sig Start Date End Date Taking? Authorizing Provider  amoxicillin-clavulanate (AUGMENTIN) 875-125 MG tablet Take 1 tablet by mouth every 12 (twelve) hours. 05/08/23  Yes Rinaldo Ratel, [place] N, FNP  fluconazole (DIFLUCAN) 150 MG tablet Take 1 tablet on day 3 of antibiotics, and 1 tablet on day 7 of antibiotics. 05/08/23  Yes Rinaldo Ratel, [place] N, FNP  amitriptyline (ELAVIL) 100 MG tablet Take 100 mg by mouth at bedtime. 03/18/21   [provider]  aspirin EC (ASPIRIN LOW DOSE) 81 MG tablet TAKE ONE TABLET BY MOUTH DAILY; SWALLOW WHOLE AS DIRECTED BY YOUR DOCTOR 12/08/22   Tolia, Sunit, DO  buPROPion (WELLBUTRIN XL) 150 MG 24 hr tablet Take 1 tablet (150 mg total) by mouth daily. PATIENT NEEDS OFFICE VISIT FOR ADDITIONAL REFILLS Patient not taking: Reported on 05/05/2023 12/04/14   Lorre Munroe, NP  Calcium Carb-Cholecalciferol 500-125 MG-UNIT TABS Take 1 tablet by mouth daily.    [provider]  clopidogrel (PLAVIX) 75 MG tablet TAKE 1 TABLET BY MOUTH DAILY 10/15/22   Tolia, Sunit, DO  fenofibrate (TRICOR) 145 MG tablet Take 1 tablet (145 mg total) by mouth daily. 05/05/23 08/03/23  Tolia, Sunit, DO  fluticasone (FLONASE) 50 MCG/ACT nasal spray Place 2 sprays into both nostrils daily as  needed for allergies or rhinitis.    [provider]  Glucosamine-Chondroitin (MOVE FREE PO) Take 1 tablet by mouth daily.    [provider]  hydrOXYzine (ATARAX/VISTARIL) 50 MG tablet Take 50 mg by mouth at bedtime. 06/14/21   [provider]  isosorbide mononitrate (IMDUR) 30 MG 24 hr tablet TAKE 1 TABLET BY MOUTH DAILY. MUST CALL PRESCRIBER FOR APPOINTMENT FOR FUTURE REFILLS. 03/11/23   Tolia, Sunit, DO  KRILL OIL PO Take 1 capsule by mouth daily.     [provider]  MAGNESIUM PO Take 400 mg by mouth daily.    [provider]  metoprolol succinate (TOPROL-XL) 25 MG 24 hr tablet Take 1 tablet (25 mg total) by mouth  daily. 05/09/22   Cantwell, Celeste C, PA-C  Multiple Vitamins-Minerals (OCUVITE ADULT 50+ PO) Take 1 tablet by mouth daily.    [provider]  nitroGLYCERIN (NITROSTAT) 0.4 MG SL tablet Place 1 tablet (0.4 mg total) under the tongue every 5 (five) minutes as needed for chest pain (Up to 3 doses in 15 minutes.). 09/22/22   Tolia, Sunit, DO  NON FORMULARY Take by mouth daily. Morning Complete powder- Fenton Malling    [provider]  NON FORMULARY cymbotica    [provider]  Nutritional Supplements (KETO PO) Take 2 tablets by mouth daily. EXIPURE    [provider]  OVER THE COUNTER MEDICATION Take 1 tablet by mouth daily. Garden of Life Probiotic for Women    [provider]  PARoxetine (PAXIL) 30 MG tablet TAKE 1 TABLET (30 MG TOTAL) BY MOUTH DAILY. MUST SCHEDULE ANNUAL PHYSICAL FOR FURTHER REFILLS Patient taking differently: Take 30 mg by mouth daily. 06/03/16   Lorre Munroe, NP  RABEprazole (ACIPHEX) 20 MG tablet Take 20 mg by mouth 2 (two) times daily. 07/24/20   [provider]  ranolazine (RANEXA) 500 MG 12 hr tablet Take 1 tablet (500 mg total) by mouth 2 (two) times daily. 05/09/22 05/05/23  Cantwell, Celeste C, PA-C  rizatriptan (MAXALT) 10 MG tablet Take 10 mg by mouth as needed for migraine.  09/25/20   [provider]  rosuvastatin (CRESTOR) 20 MG tablet Take 1 tablet (20 mg total) by mouth at bedtime. 05/05/23 08/03/23  Tolia, Sunit, DO  sacubitril-valsartan (ENTRESTO) 24-26 MG TAKE 1 TABLET BY MOUTH TWICE A DAY 04/01/23   Tolia, Sunit, DO  TURMERIC PO Take 2,250 mg by mouth in the morning and at bedtime.    [provider]    Family History Family History  Problem Relation Age of Onset   Arthritis Mother    Colon cancer Mother 74   Heart disease Father    Parkinson's disease Father    Hypertension Brother    Cancer Maternal Grandmother    Heart disease Paternal Grandmother    Arthritis Paternal Grandmother      Social History Social History   Tobacco Use   Smoking status: Never   Smokeless tobacco: Never  Vaping Use   Vaping Use: Never used  Substance Use Topics   Alcohol use: Yes    Comment: occ   Drug use: No     Allergies   Hydrocodone-acetaminophen, Sumatriptan, Ambien [zolpidem tartrate], Cheese, Codeine, Benadryl [diphenhydramine], and Penicillins   Review of Systems Review of Systems  Skin:  Positive for wound.     Physical Exam Triage Vital Signs ED Triage Vitals [05/08/23 2001]  Enc Vitals Group     BP 132/77     Pulse Rate  93     Resp 16     Temp 97.8 F (36.6 C)     Temp src      SpO2 98 %     Weight      Height      Head Circumference      Peak Flow      Pain Score 7     Pain Loc      Pain Edu?      Excl. in GC?    No data found.  Updated Vital Signs BP 132/77   Pulse 93   Temp 97.8 F (36.6 C)   Resp 16   SpO2 98%   Visual Acuity Right Eye Distance:   Left Eye Distance:   Bilateral Distance:    Right Eye Near:   Left Eye Near:    Bilateral Near:     Physical Exam Vitals and nursing note reviewed.  Constitutional:      Appearance: Normal appearance.  HENT:     Head: Normocephalic and atraumatic.     Right Ear: External ear normal.     Left Ear: External ear normal.     Nose: Nose normal.     Mouth/Throat:     Mouth: Mucous membranes are moist.  Eyes:     Conjunctiva/sclera: Conjunctivae normal.  Cardiovascular:     Rate and Rhythm: Normal rate and regular rhythm.     Heart sounds: Normal heart sounds. No murmur heard. Pulmonary:     Effort: Pulmonary effort is normal. No respiratory distress.     Breath sounds: Normal breath sounds.  Musculoskeletal:        General: Tenderness and signs of injury present. No swelling or deformity. Normal range of motion.     Left hand: Swelling and tenderness present. No bony tenderness. Normal range of motion. Normal strength. Normal sensation. There is no disruption of two-point  discrimination. Normal capillary refill. Normal pulse.     Cervical back: Normal range of motion.  Skin:    General: Skin is warm and dry.     Capillary Refill: Capillary refill takes less than 2 seconds.  Neurological:     General: No focal deficit present.     Mental Status: She is alert.  Psychiatric:        Mood and Affect: Mood normal.      UC Treatments / Results  Labs (all labs ordered are listed, but only abnormal results are displayed) Labs Reviewed - No data to display  EKG   Radiology No results found.  Procedures Procedures (including critical care time)  Medications Ordered in UC Medications  ketorolac (TORADOL) 30 MG/ML injection 30 mg (30 mg Intramuscular Given 05/08/23 2035)    Initial Impression / Assessment and Plan / UC Course  I have reviewed the triage vital signs and the nursing notes.  Pertinent labs & imaging results that were available during my care of the patient were reviewed by me and considered in my medical decision making (see chart for details).  Vitals and triage reviewed, patient is hemodynamically stable.  Bruising to her right breast, no acute lacerations.  Encouraged ice.  3 puncture wounds to her left hand.  Range of motion intact.  Bruising and swelling noted.  Neurovascularly intact.  Wound care provided in clinic.  Pets up-to-date on vaccines.  Patient reports allergy to penicillin causes nausea and vomiting and thrush.  Discussed taking Augmentin with food, will provide Diflucan.  Return and follow-up precautions given, warning signs  for infection discussed, no questions at this time.     Final Clinical Impressions(s) / UC Diagnoses   Final diagnoses:  Dog bite of left hand, initial encounter     Discharge Instructions      We have cleaned your wounds today.  Please keep your wounds clean and dry, you can apply antibacterial ointment and keep them covered.  Your bruising and swelling will progress over the next 24 hours,  please keep your left hand elevated, you can apply ice as needed.  Please take Tylenol as needed for pain.  Please take all antibiotics as prescribed and until finished, I advise taking them with food to help prevent gastrointestinal upset.  It is important to take all antibiotics until finished, dog bites are notorious for becoming infected.  Taking the antibiotics will help prevent against skin infection, that can progress to sepsis, and this is life-threatening.  Please return to clinic if you develop any streaking, fever, purulent drainage, or worsening of swelling despite finishing antibiotics.      ED Prescriptions     Medication Sig Dispense Auth. Provider   amoxicillin-clavulanate (AUGMENTIN) 875-125 MG tablet Take 1 tablet by mouth every 12 (twelve) hours. 14 tablet Rinaldo Ratel, [place] N, [place]   fluconazole (DIFLUCAN) 150 MG tablet Take 1 tablet on day 3 of antibiotics, and 1 tablet on day 7 of antibiotics. 2 tablet Kue Fox, [place] N, FNP      PDMP not reviewed this encounter.   Chantil Bari, [place] N, [place] 05/08/23 2039

## 2023-05-08 NOTE — Discharge Instructions (Addendum)
We have cleaned your wounds today.  Please keep your wounds clean and dry, you can apply antibacterial ointment and keep them covered.  Your bruising and swelling will progress over the next 24 hours, please keep your left hand elevated, you can apply ice as needed.  Please take Tylenol as needed for pain.  Please take all antibiotics as prescribed and until finished, I advise taking them with food to help prevent gastrointestinal upset.  It is important to take all antibiotics until finished, dog bites are notorious for becoming infected.  Taking the antibiotics will help prevent against skin infection, that can progress to sepsis, and this is life-threatening.  Please return to clinic if you develop any streaking, fever, purulent drainage, or worsening of swelling despite finishing antibiotics.

## 2023-06-06 ENCOUNTER — Other Ambulatory Visit: Payer: Self-pay | Admitting: Cardiology

## 2023-06-06 DIAGNOSIS — I2510 Atherosclerotic heart disease of native coronary artery without angina pectoris: Secondary | ICD-10-CM

## 2023-06-06 DIAGNOSIS — I255 Ischemic cardiomyopathy: Secondary | ICD-10-CM

## 2023-07-29 ENCOUNTER — Other Ambulatory Visit: Payer: Self-pay | Admitting: Cardiology

## 2023-07-29 DIAGNOSIS — E781 Pure hyperglyceridemia: Secondary | ICD-10-CM

## 2023-07-29 DIAGNOSIS — E782 Mixed hyperlipidemia: Secondary | ICD-10-CM

## 2023-08-07 ENCOUNTER — Other Ambulatory Visit: Payer: Self-pay | Admitting: Cardiology

## 2023-08-07 DIAGNOSIS — E781 Pure hyperglyceridemia: Secondary | ICD-10-CM

## 2023-08-07 DIAGNOSIS — E782 Mixed hyperlipidemia: Secondary | ICD-10-CM

## 2023-09-05 IMAGING — DX DG FOOT COMPLETE 3+V*R*
3 series · 3 of 3 positions shown · non-contrast
Comparison: None Available.

CLINICAL DATA: Fall.

EXAM:
RIGHT FOOT COMPLETE - 3+ VIEW

[foot ap]
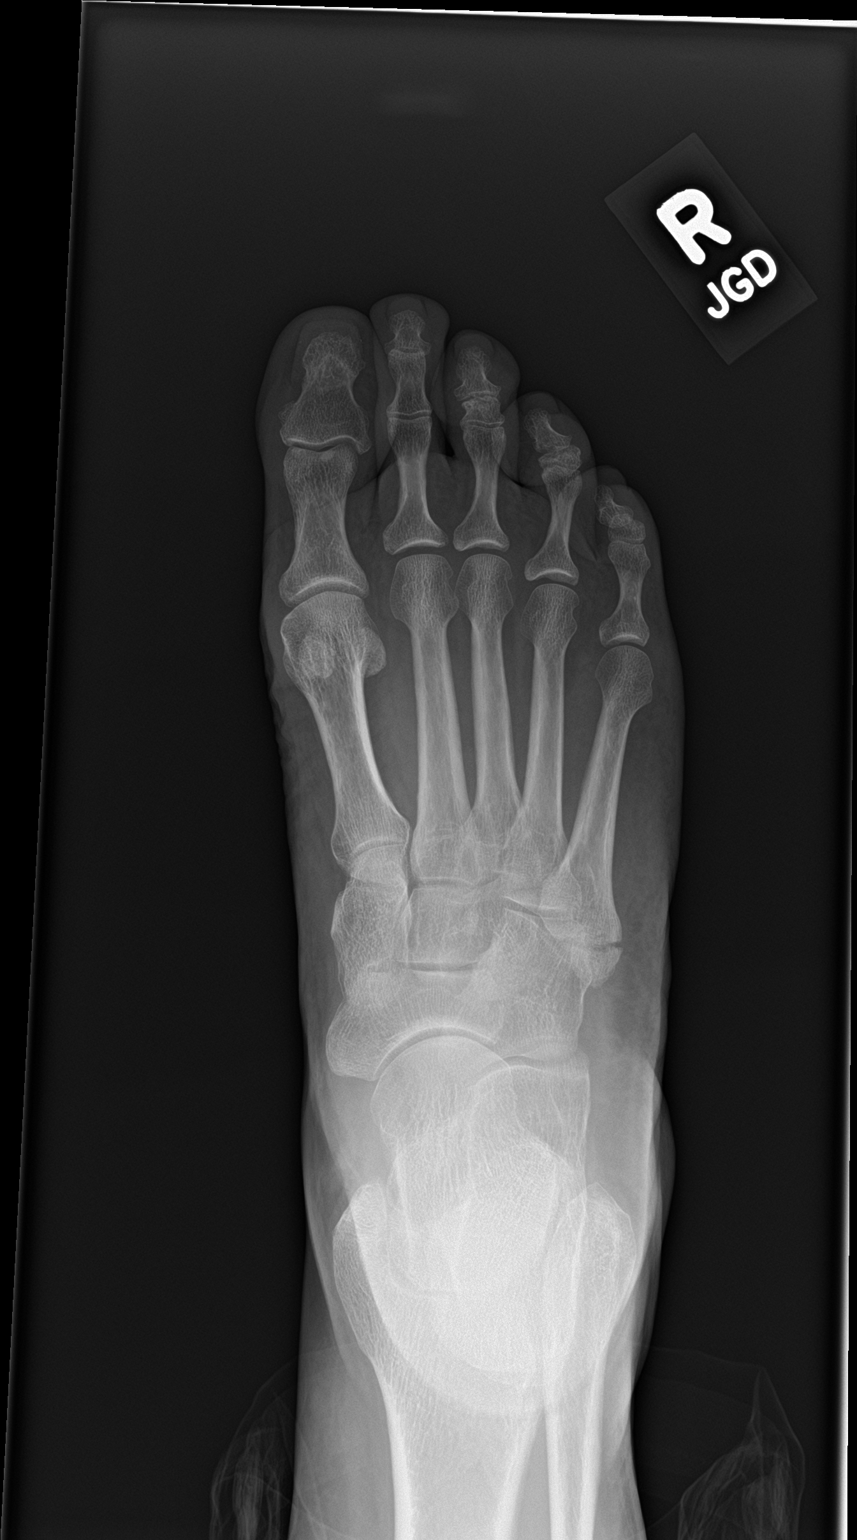

[foot obl]
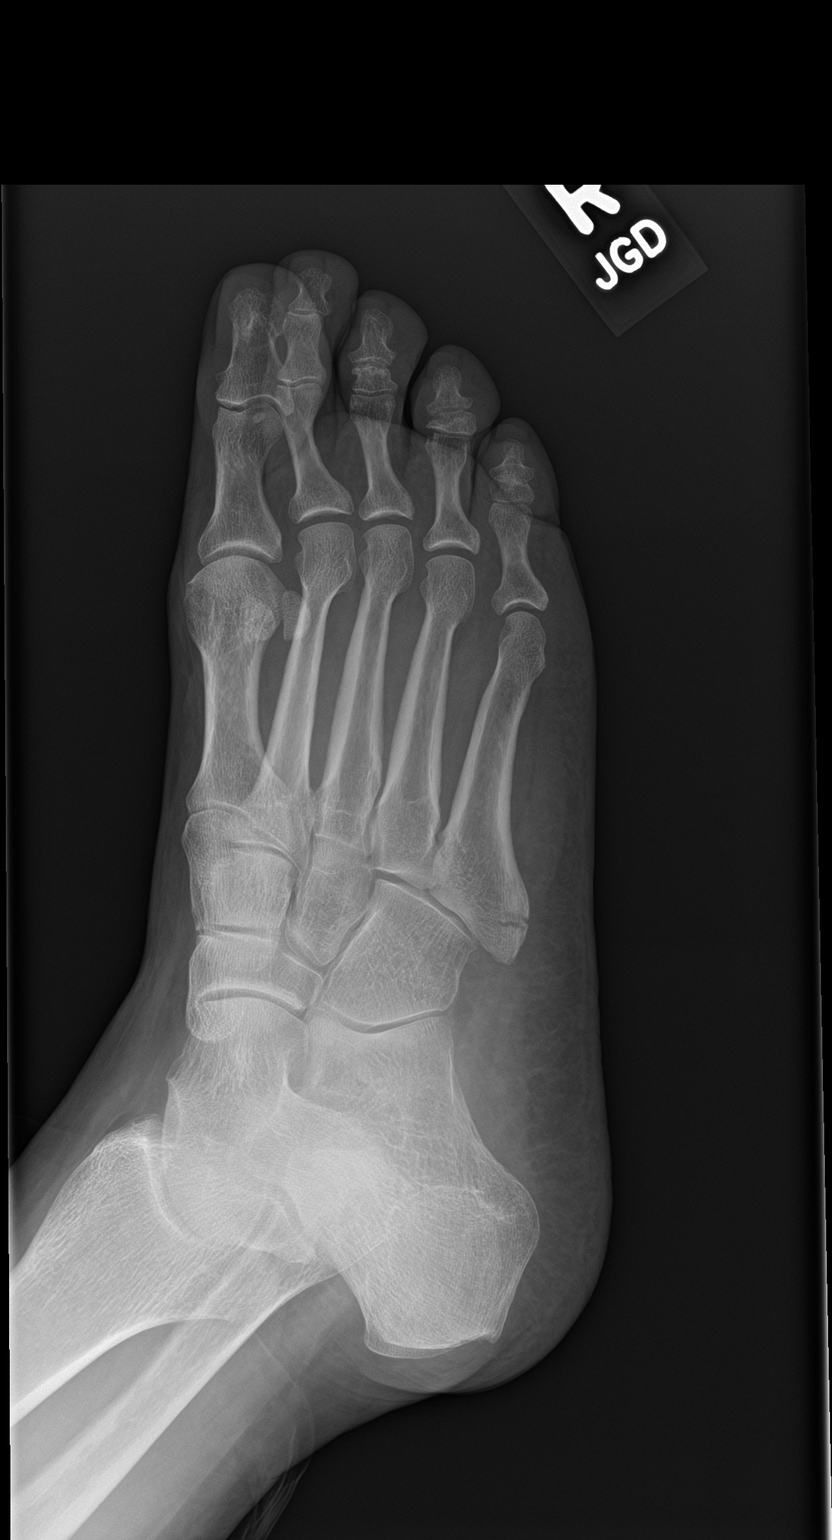

[foot lat]
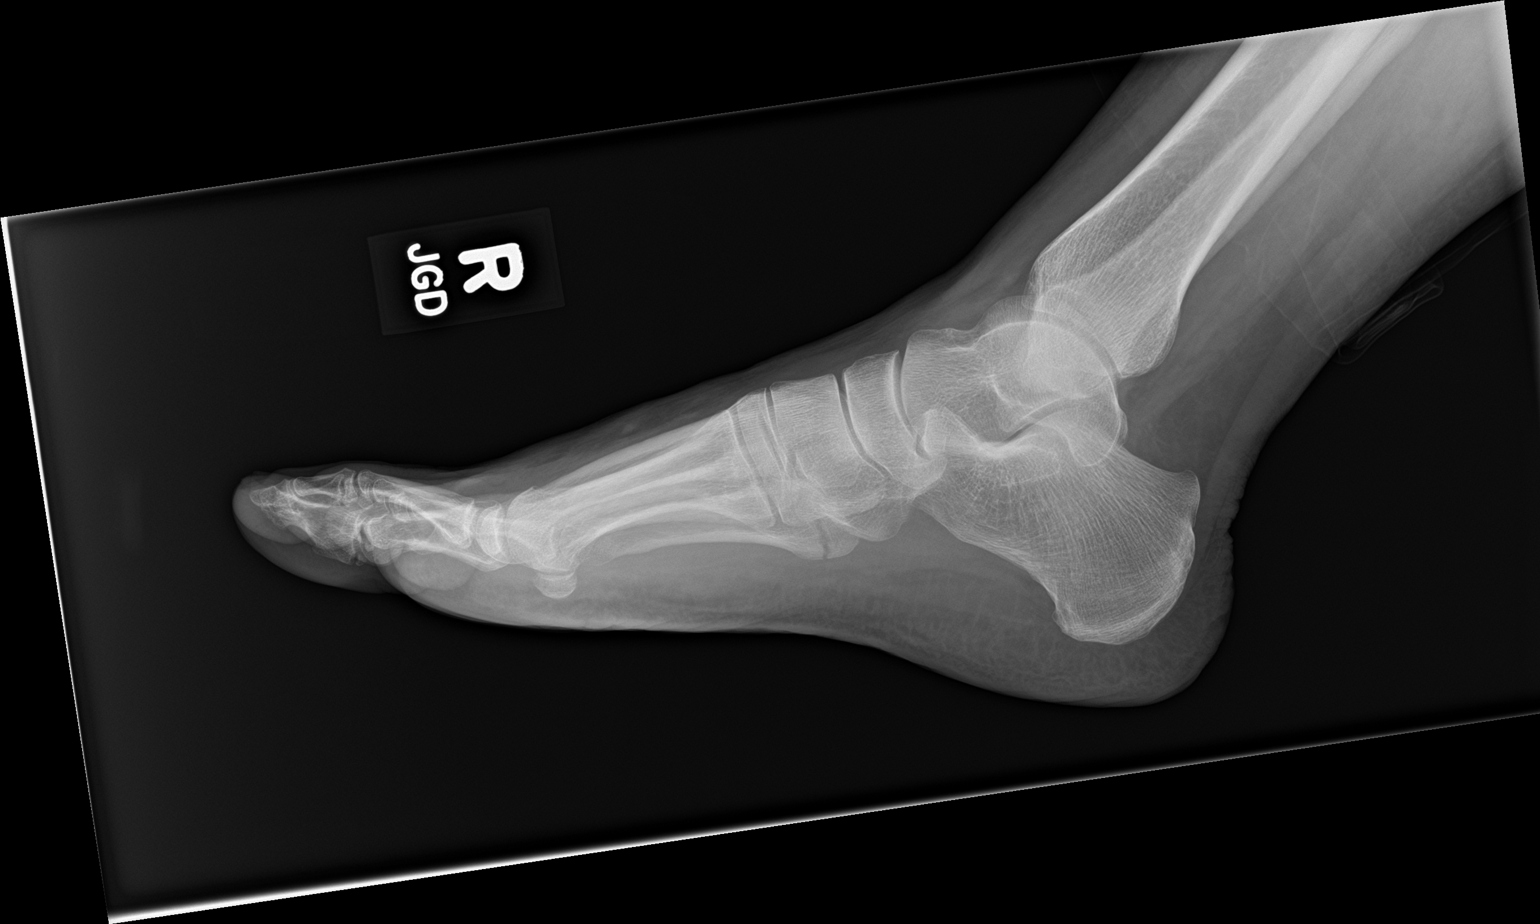

[3 of 3 positions shown; findings below may reference images not displayed]

FINDINGS: Nondisplaced avulsion fracture of the base of the fifth metatarsal.
No other acute fracture. There is no dislocation. The bones are well
mineralized. No arthritic changes. The soft tissues are
unremarkable.
IMPRESSION: Nondisplaced avulsion fracture of the base of the fifth metatarsal.

## 2023-09-08 ENCOUNTER — Other Ambulatory Visit: Payer: Self-pay | Admitting: Cardiology

## 2023-09-08 DIAGNOSIS — I255 Ischemic cardiomyopathy: Secondary | ICD-10-CM

## 2023-09-08 DIAGNOSIS — I2510 Atherosclerotic heart disease of native coronary artery without angina pectoris: Secondary | ICD-10-CM

## 2023-09-09 ENCOUNTER — Encounter (HOSPITAL_COMMUNITY): Payer: Self-pay

## 2023-09-09 ENCOUNTER — Ambulatory Visit (INDEPENDENT_AMBULATORY_CARE_PROVIDER_SITE_OTHER): Payer: Medicare HMO

## 2023-09-09 ENCOUNTER — Ambulatory Visit (HOSPITAL_COMMUNITY)
Admission: EM | Admit: 2023-09-09 | Discharge: 2023-09-09 | Disposition: A | Discharge status: Home / Self Care | Payer: Medicare HMO | Attending: Family Medicine | Admitting: Family Medicine

## 2023-09-09 DIAGNOSIS — S41132A Puncture wound without foreign body of left upper arm, initial encounter: Secondary | ICD-10-CM

## 2023-09-09 DIAGNOSIS — M79632 Pain in left forearm: Secondary | ICD-10-CM | POA: Diagnosis not present

## 2023-09-09 DIAGNOSIS — W540XXA Bitten by dog, initial encounter: Secondary | ICD-10-CM

## 2023-09-09 MED ORDER — ONDANSETRON 4 MG PO TBDP: 4.0000 mg | ORAL_TABLET | Freq: Three times a day (TID) | ORAL | 0 refills | Status: DC | PRN

## 2023-09-09 MED ORDER — AMOXICILLIN-POT CLAVULANATE 875-125 MG PO TABS: 1.0000 | ORAL_TABLET | Freq: Two times a day (BID) | ORAL | 0 refills | Status: DC

## 2023-09-09 NOTE — ED Triage Notes (Signed)
Pt is here for a dog bite to her left forearm. Dog is up to date with vaccine.

## 2023-09-12 NOTE — ED Provider Notes (Signed)
[facility]   [id number] 09/09/23 Arrival Time: 1915  ASSESSMENT & PLAN:  1. Left forearm pain   2. Dog bite, initial encounter    I have personally viewed and independently interpreted the imaging studies ordered this visit. L forearm: no bony acute changes; no radiopaque FB appreciated.  Meds ordered this encounter  Medications   amoxicillin-clavulanate (AUGMENTIN) 875-125 MG tablet    Sig: Take 1 tablet by mouth every 12 (twelve) hours.    Dispense:  20 tablet    Refill:  0   ondansetron (ZOFRAN-ODT) 4 MG disintegrating tablet    Sig: Take 1 tablet (4 mg total) by mouth every 8 (eight) hours as needed for nausea or vomiting.    Dispense:  15 tablet    Refill:  0  Reports antibiotic may cause nausea.  Declines Td this evening.  Orders Placed This Encounter  Procedures   DG Forearm Left   Wound care instructions discussed. Watch closely for s/s of infection. She voices understanding.  Recommend:  Follow-up Information     [facility].   Specialty: Urgent Care Why: If worsening or failing to improve as anticipated. Contact information: [address] [place] [address] [phone]               Prefers only Tylenol for pain.  Reviewed expectations re: course of current medical issues. Questions answered. Outlined signs and symptoms indicating need for more acute intervention. Patient verbalized understanding. After Visit Summary given.  SUBJECTIVE: History from: patient. Mary Glass is a 67 y.o. female who reports dog bite to L forearm; her great dane; still bleeding a little; moderate pain. Denies any distal ROM loss or sensation changes. Unsure of last Td. Animal control already contacted. Reports dog's vaccines are up to date,  Past Surgical History:  Procedure Laterality Date   ABDOMINAL HYSTERECTOMY  1995   CORONARY PRESSURE/FFR STUDY N/A 10/09/2020   Procedure: INTRAVASCULAR  PRESSURE WIRE/FFR STUDY;  Surgeon: Elder Negus, MD;  [facility];  Service: Cardiovascular;  Laterality: N/A;   FOOT SURGERY     LEFT HEART CATH AND CORONARY ANGIOGRAPHY N/A 10/09/2020   Procedure: LEFT HEART CATH AND CORONARY ANGIOGRAPHY;  Surgeon: Elder Negus, MD;  [facility];  Service: Cardiovascular;  Laterality: N/A;      OBJECTIVE:  Vitals:   09/09/23 1926  BP: 126/80  Pulse: (!) 110  Resp: 18  Temp: 98.5 F (36.9 C)  TempSrc: Oral  SpO2: 99%    General appearance: alert; no distress HEENT: [place]; AT Neck: supple with FROM Resp: unlabored respirations Extremities: LUE: warm with well perfused appearance; with four (4) sub cm puncture sounds to forearm in shape of dog bite; fairly clean edges; tender around all wounds; minimal bleeding; some bruising; all fingers and wrist with FROM; all fingers with normal distal sensation and normal capillary refill CV: brisk extremity capillary refill of LUE; 2+ radial pulse of LUE Skin: warm and dry; no visible rashes Neurologic: normal sensation and strength of LUR Psychological: alert and cooperative; normal mood and affect  Imaging: DG Forearm Left  Result Date: 09/09/2023 CLINICAL DATA:  Dog bite to the forearm. EXAM: LEFT FOREARM - 2 VIEW COMPARISON:  None Available. FINDINGS: There is no evidence of fracture or other focal bone lesions. Wrist and elbow alignment are maintained. A dressing overlies the mid forearm. Soft tissue edema. No radiopaque foreign body. IMPRESSION: Soft tissue edema. No fracture or  radiopaque foreign body. Electronically Signed   By: Narda Rutherford M.D.   On: 09/09/2023 19:42       Allergies  Allergen Reactions   Hydrocodone-Acetaminophen     Other reaction(s): Other hyperactivity   Sumatriptan     Other reaction(s): Other Head pain    Ambien [Zolpidem Tartrate] Other (See Comments)    headaches   Cheese Other (See Comments)    Causes migraine    Codeine Other (See Comments)    hyperactivity   Benadryl [Diphenhydramine] Rash    Topical only, makes rash worse   Penicillins Nausea And Vomiting and Rash    Past Medical History:  Diagnosis Date   Allergy    Anxiety    Arthritis    Coronary atherosclerosis of native coronary artery    Depression    GERD (gastroesophageal reflux disease)    Hyperlipidemia    LBBB (left bundle branch block)    Migraine    Social History   Socioeconomic History   Marital status: Married    Spouse name: Not on file   Number of children: 2   Years of education: Not on file   Highest education level: Not on file  Occupational History   Not on file  Tobacco Use   Smoking status: Never   Smokeless tobacco: Never  Vaping Use   Vaping status: Never Used  Substance and Sexual Activity   Alcohol use: Yes    Comment: occ   Drug use: No   Sexual activity: Yes    Birth control/protection: None  Other Topics Concern   Not on file  Social History Narrative   Not on file   Social Determinants of Health   Financial Resource Strain: Low Risk  (12/08/2022)   Received from [facility], [facility]   Overall Financial Resource Strain (CARDIA)    Difficulty of Paying Living Expenses: Not hard at all  Food Insecurity: No Food Insecurity (12/08/2022)   Received from [facility], [facility]   Hunger Vital Sign    Worried About Running Out of Food in the Last Year: Never true    Ran Out of Food in the Last Year: Never true  Transportation Needs: No Transportation Needs (12/08/2022)   Received from [employer], [facility]   PRAPARE - Transportation    Lack of Transportation (Medical): No    Lack of Transportation (Non-Medical): No  Physical Activity: Insufficiently Active (12/08/2022)   Received from [facility], [facility]   Exercise Vital Sign    Days of Exercise per Week: 3 days    Minutes of Exercise per Session: 20 min  Stress: No Stress Concern Present (12/08/2022)    Received from [facility], [facility] - Occupational Stress Questionnaire    Feeling of Stress : Only a little  Social Connections: Moderately Integrated (12/08/2022)   Received from [facility], [facility]   Social Network    How would you rate your social network (family, work, friends)?: Adequate participation with social networks   Family History  Problem Relation Age of Onset   Arthritis Mother    Colon cancer Mother 85   Heart disease Father    Parkinson's disease Father    Hypertension Brother    Cancer Maternal Grandmother    Heart disease Paternal Grandmother    Arthritis Paternal Grandmother    Past Surgical History:  Procedure Laterality Date   ABDOMINAL HYSTERECTOMY  1995   CORONARY PRESSURE/FFR STUDY  N/A 10/09/2020   Procedure: INTRAVASCULAR PRESSURE WIRE/FFR STUDY;  Surgeon: Elder Negus, MD;  [facility];  Service: Cardiovascular;  Laterality: N/A;   FOOT SURGERY     LEFT HEART CATH AND CORONARY ANGIOGRAPHY N/A 10/09/2020   Procedure: LEFT HEART CATH AND CORONARY ANGIOGRAPHY;  Surgeon: Elder Negus, MD;  [facility];  Service: Cardiovascular;  Laterality: N/A;       Mardella Layman, MD 09/12/23 1020

## 2023-09-15 ENCOUNTER — Other Ambulatory Visit: Payer: Self-pay | Admitting: Orthopaedic Surgery

## 2023-09-15 ENCOUNTER — Encounter: Payer: Self-pay | Admitting: Orthopaedic Surgery

## 2023-09-15 DIAGNOSIS — M19219 Secondary osteoarthritis, unspecified shoulder: Secondary | ICD-10-CM

## 2023-09-18 ENCOUNTER — Ambulatory Visit
Admission: RE | Admit: 2023-09-18 | Discharge: 2023-09-18 | Disposition: A | Discharge status: Home / Self Care | Payer: Medicare HMO | Source: Ambulatory Visit | Attending: Orthopaedic Surgery

## 2023-09-18 DIAGNOSIS — M19219 Secondary osteoarthritis, unspecified shoulder: Secondary | ICD-10-CM

## 2023-10-01 IMAGING — CT CT ANGIO CHEST
2 of 7 series · 17 of 46 positions shown · IV contrast (agent unspecified)
Comparison: 03/04/2004

CLINICAL DATA: Pulmonary embolism (PE) suspected, high prob sharp
chest pain, recent foot fracture in boot

EXAM:
CT ANGIOGRAPHY CHEST WITH CONTRAST
TECHNIQUE: Multidetector CT imaging of the chest was performed using the
standard protocol during bolus administration of intravenous
contrast. Multiplanar CT image reconstructions and MIPs were
obtained to evaluate the vascular anatomy.

[Series 5: pe axial thins · axial · 0.79mm/px · z∈[+1093,+1319]mm · 14 of 262 slices shown]
[im 18/262  lung]
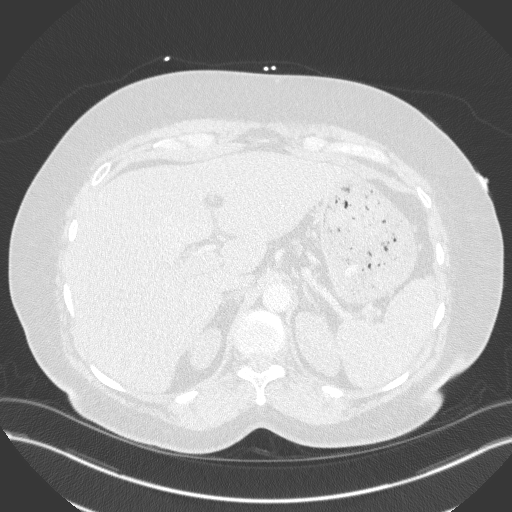
[im 35/262  soft-tissue]
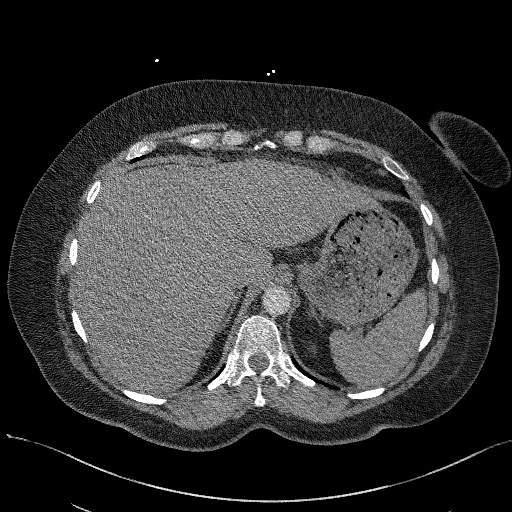
[im 53/262  lung]
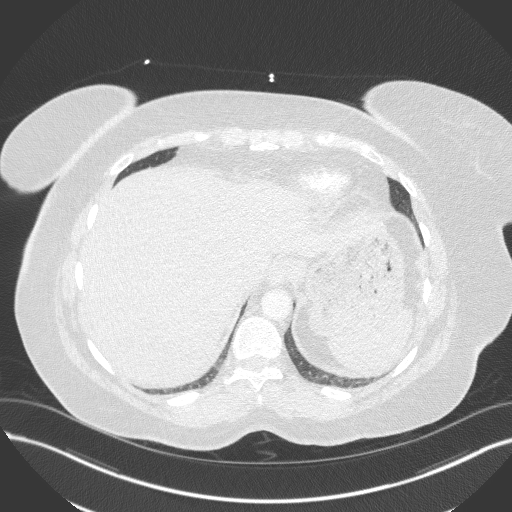
[im 70/262  soft-tissue]
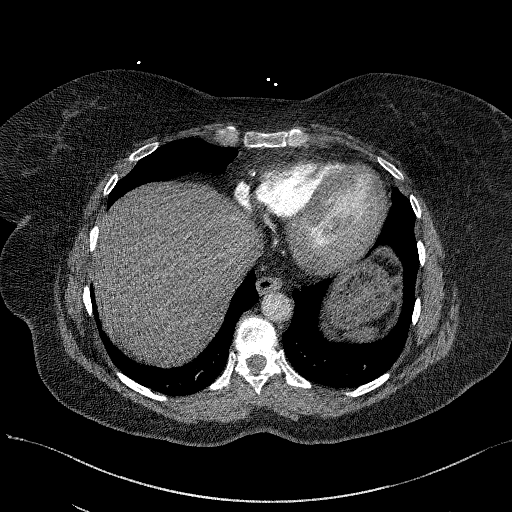
[im 88/262  lung]
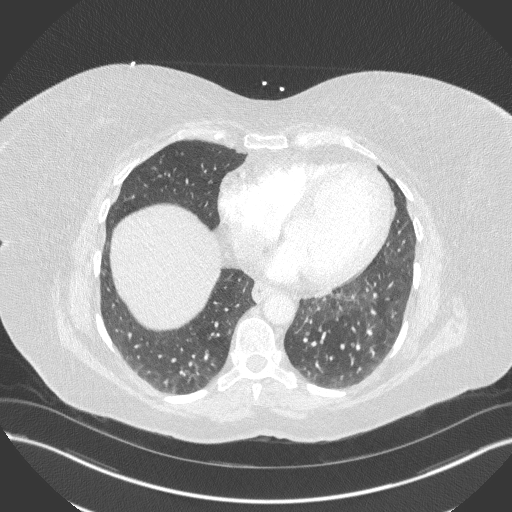
[im 105/262  soft-tissue]
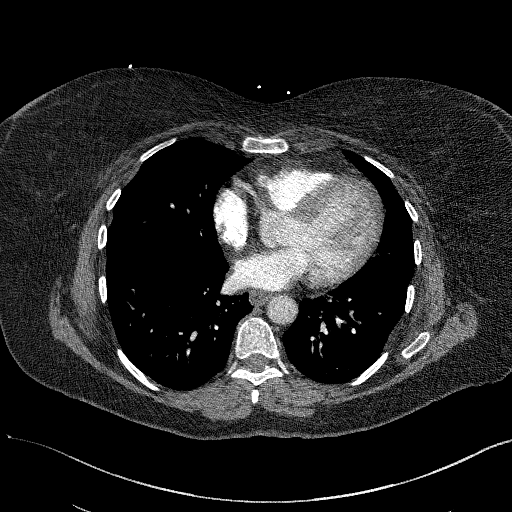
[im 122/262  lung]
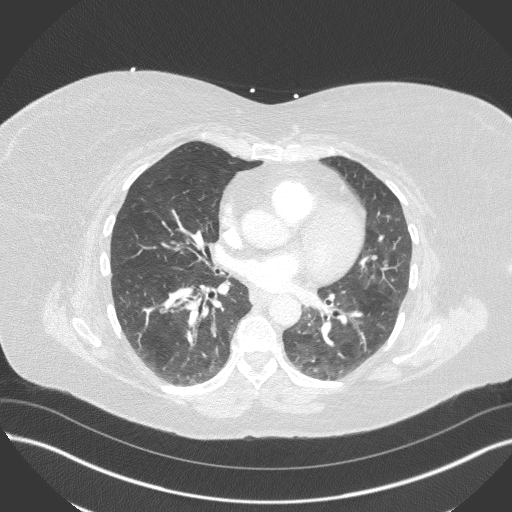
[im 140/262  soft-tissue]
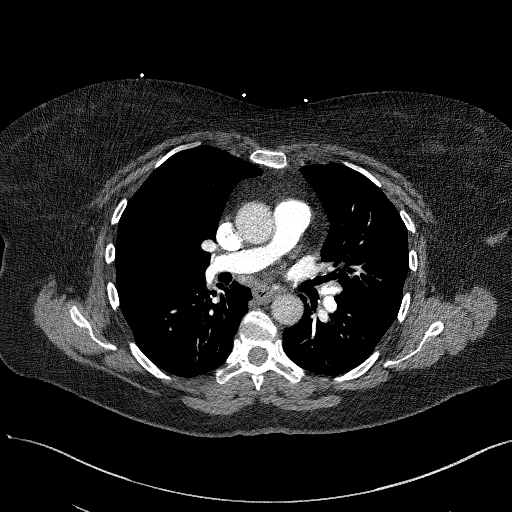
[im 157/262  lung]
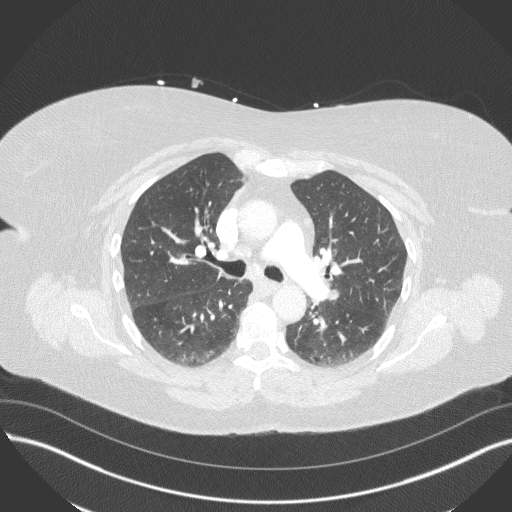
[im 175/262  soft-tissue]
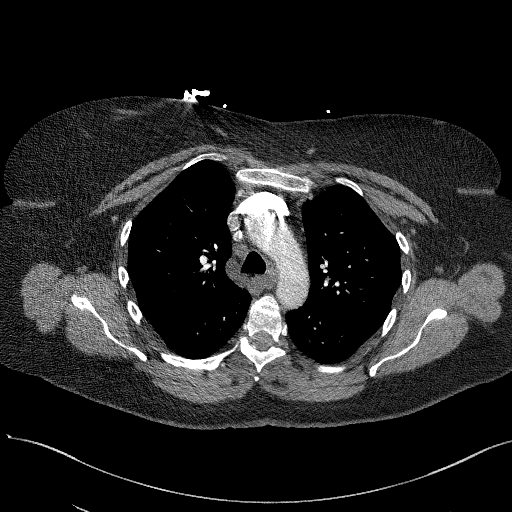
[im 192/262  lung]
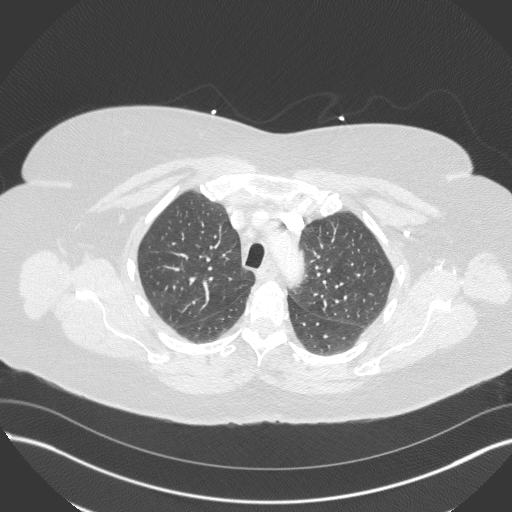
[im 209/262  soft-tissue]
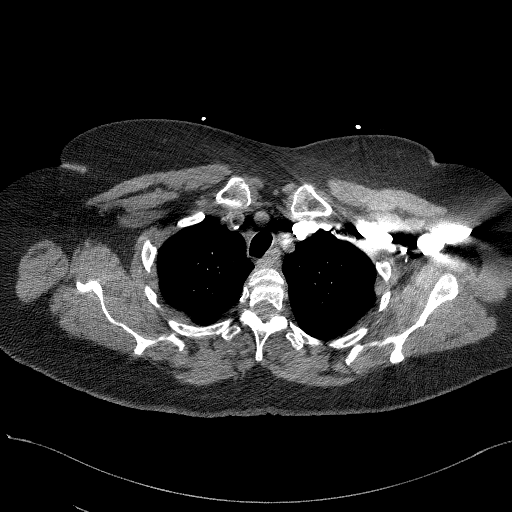
[im 227/262  lung]
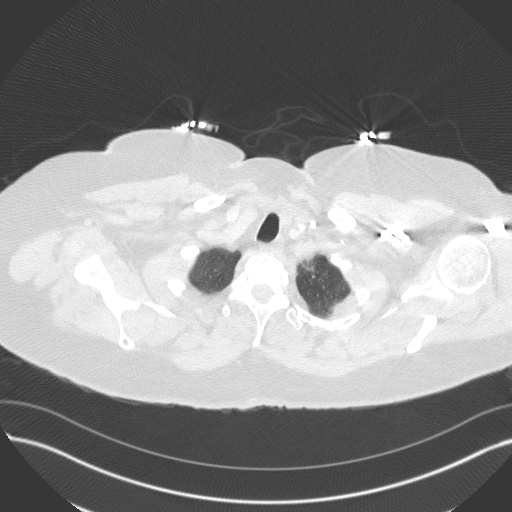
[im 244/262  soft-tissue]
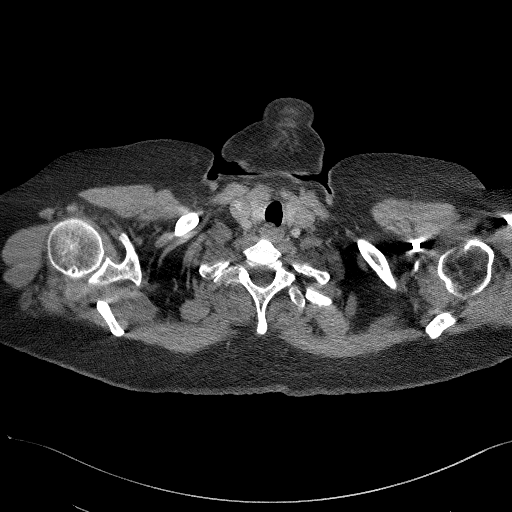

[Series 7: cor soft · coronal · 0.54mm/px · 3 of 150 slices shown]
[im 38/150  soft-tissue]
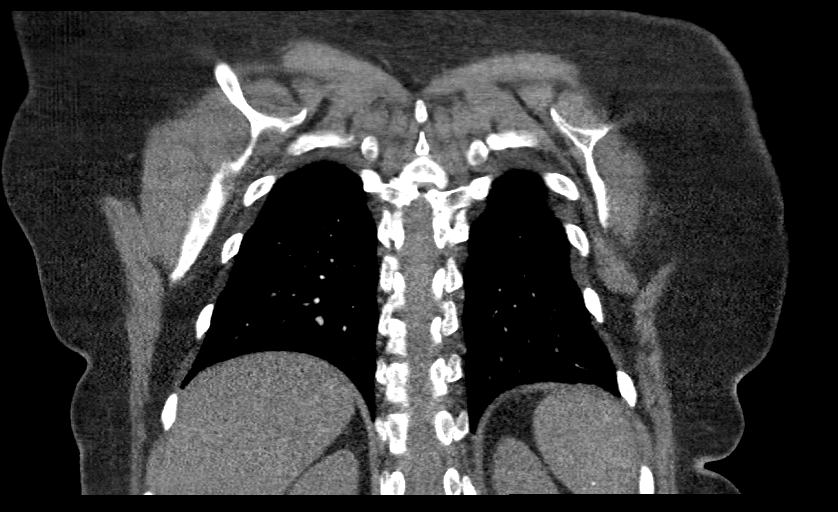
[im 75/150  soft-tissue]
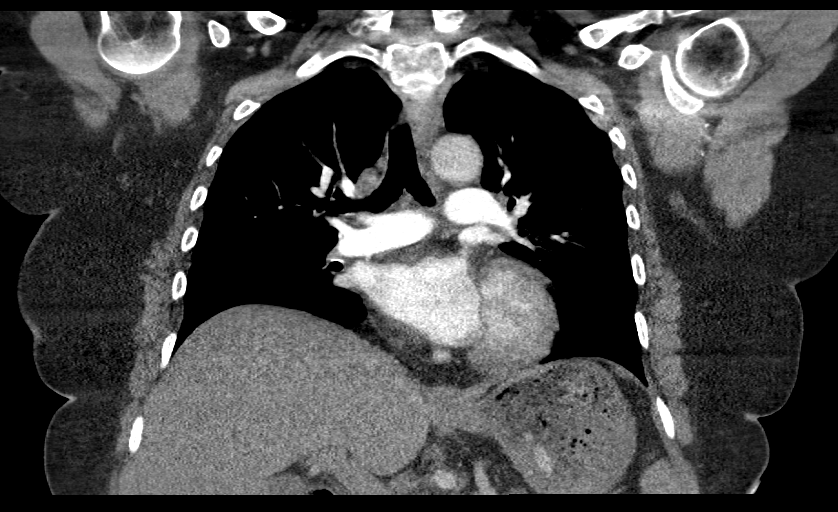
[im 112/150  soft-tissue]
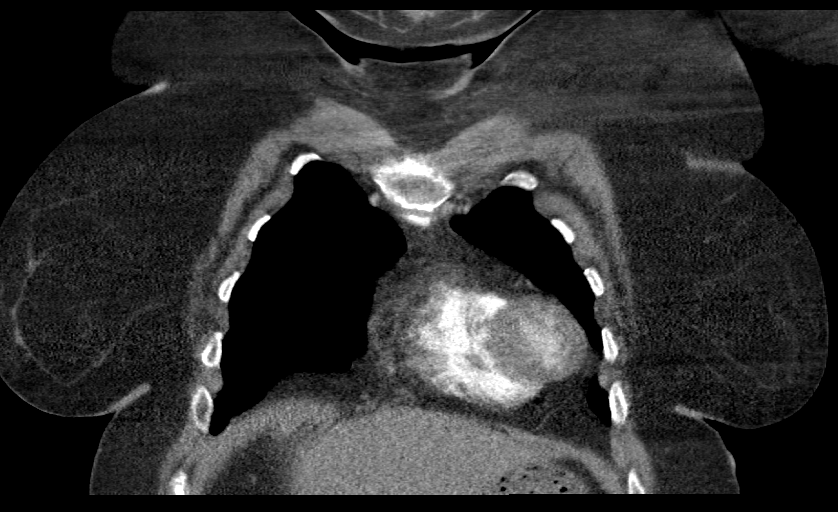

[17 of 46 positions shown; findings below may reference images not displayed]

RADIATION DOSE REDUCTION: This exam was performed according to the
departmental dose-optimization program which includes automated
exposure control, adjustment of the mA and/or kV according to
patient size and/or use of iterative reconstruction technique.

CONTRAST:  75mL OMNIPAQUE IOHEXOL 350 MG/ML SOLN
FINDINGS: Cardiovascular: No filling defects in the pulmonary arteries to
suggest pulmonary emboli. Heart is normal size. Aorta is normal
caliber.

Mediastinum/Nodes: No mediastinal, hilar, or axillary adenopathy.
Trachea and esophagus are unremarkable. Thyroid unremarkable.

Lungs/Pleura: Calcified granulomas in the left lower lobe.
Noncalcified left lower lobe nodule along the fissure on image 65
measures 6 mm. No confluent airspace opacities otherwise. No
effusions.

Upper Abdomen: No acute findings

Musculoskeletal: Chest wall soft tissues are unremarkable. No acute
bony abnormality.

Review of the MIP images confirms the above findings.
IMPRESSION: No evidence of pulmonary embolus

6 mm left lower lobe pulmonary nodule. Non-contrast chest CT at 6-12
months is recommended. If the nodule is stable at time of repeat CT,
then future CT at 18-24 months (from today's scan) is considered
optional for low-risk patients, but is recommended for high-risk
patients. This recommendation follows the consensus statement:
Guidelines for Management of Incidental Pulmonary Nodules Detected
[DATE].

No acute findings crash that no acute cardiopulmonary disease.

Old granulomatous disease.

## 2023-10-01 IMAGING — DX DG CHEST 2V
2 series · 2 of 2 positions shown · non-contrast
Comparison: 10/07/2020

CLINICAL DATA: Chest pain

EXAM:
CHEST - 2 VIEW

[chest pa]
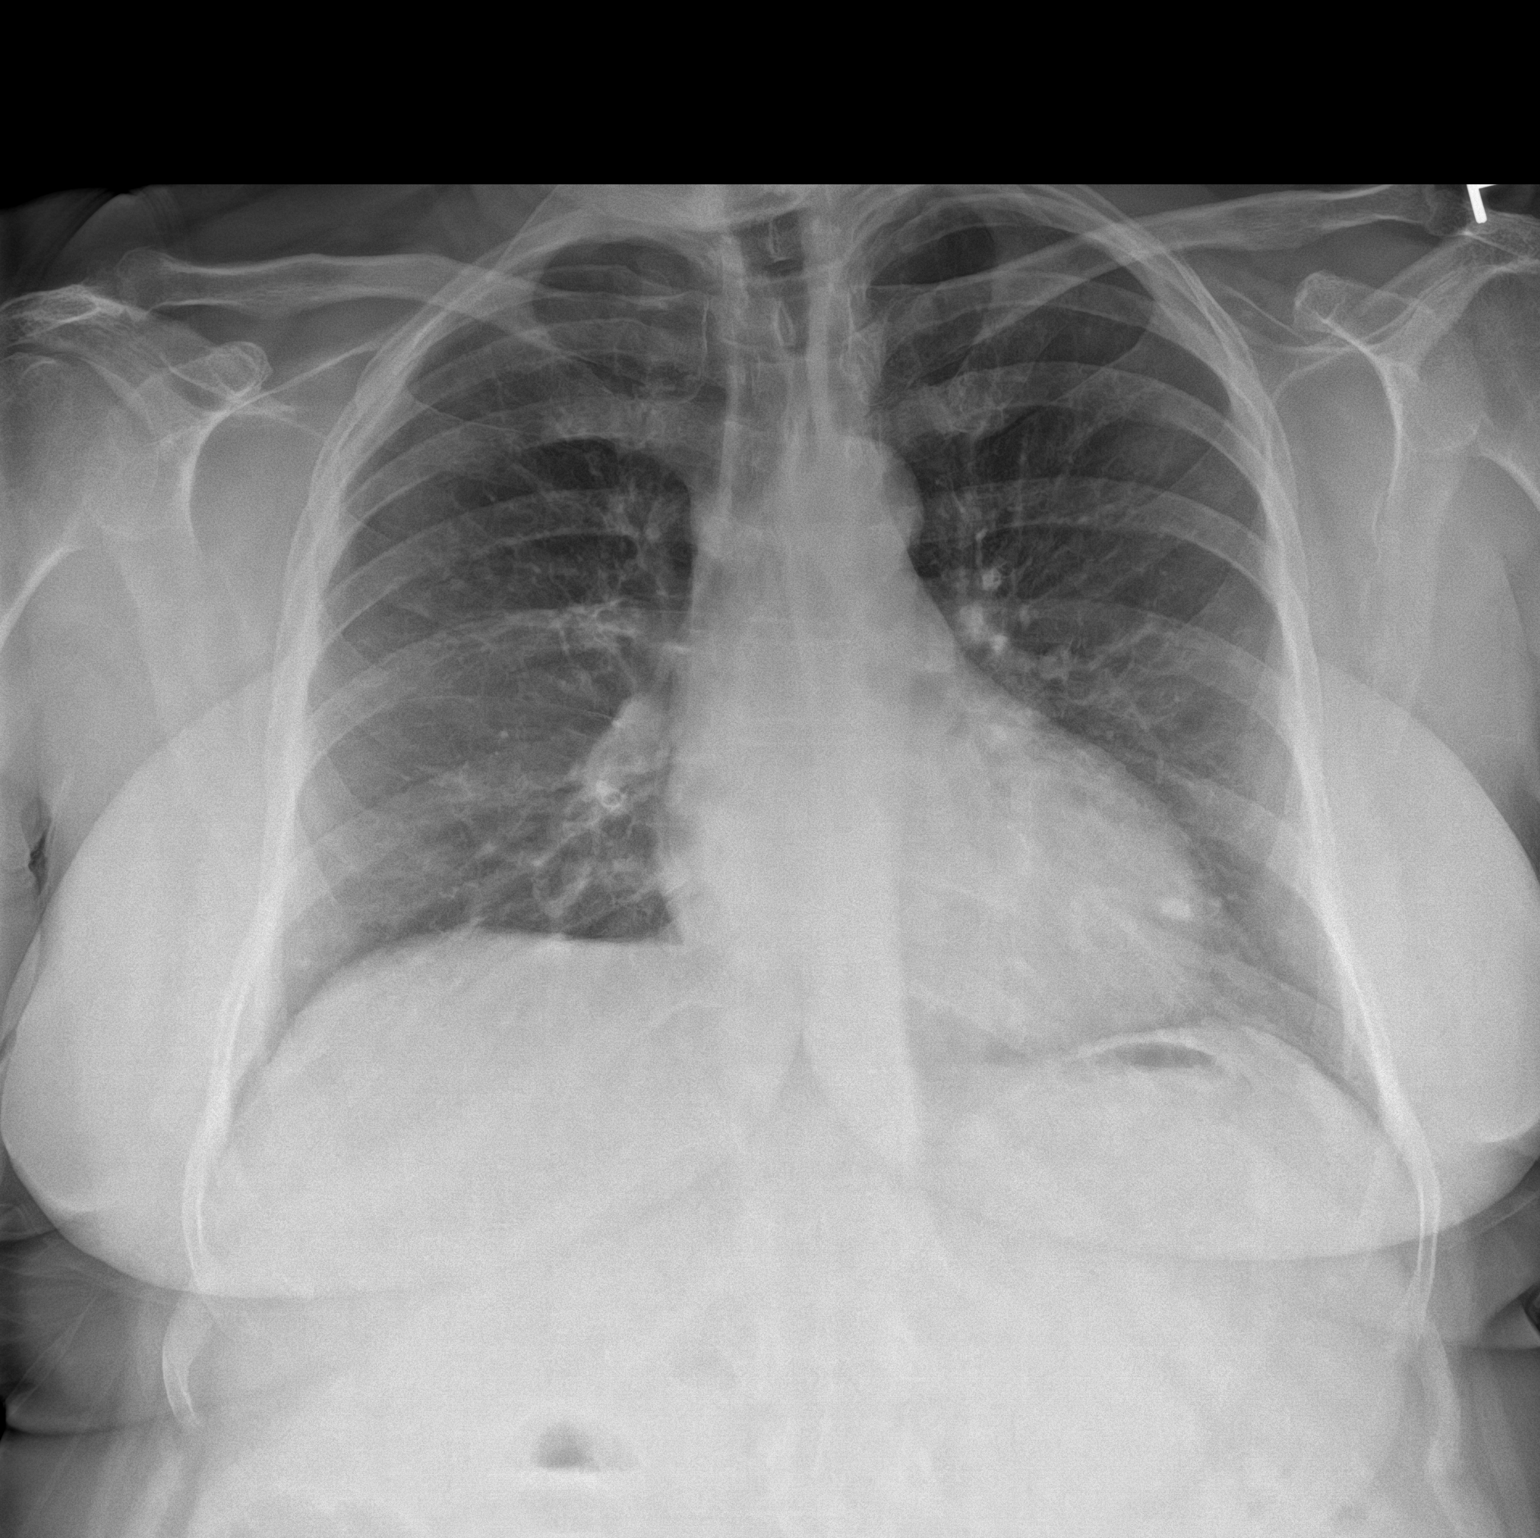

[chest lat]
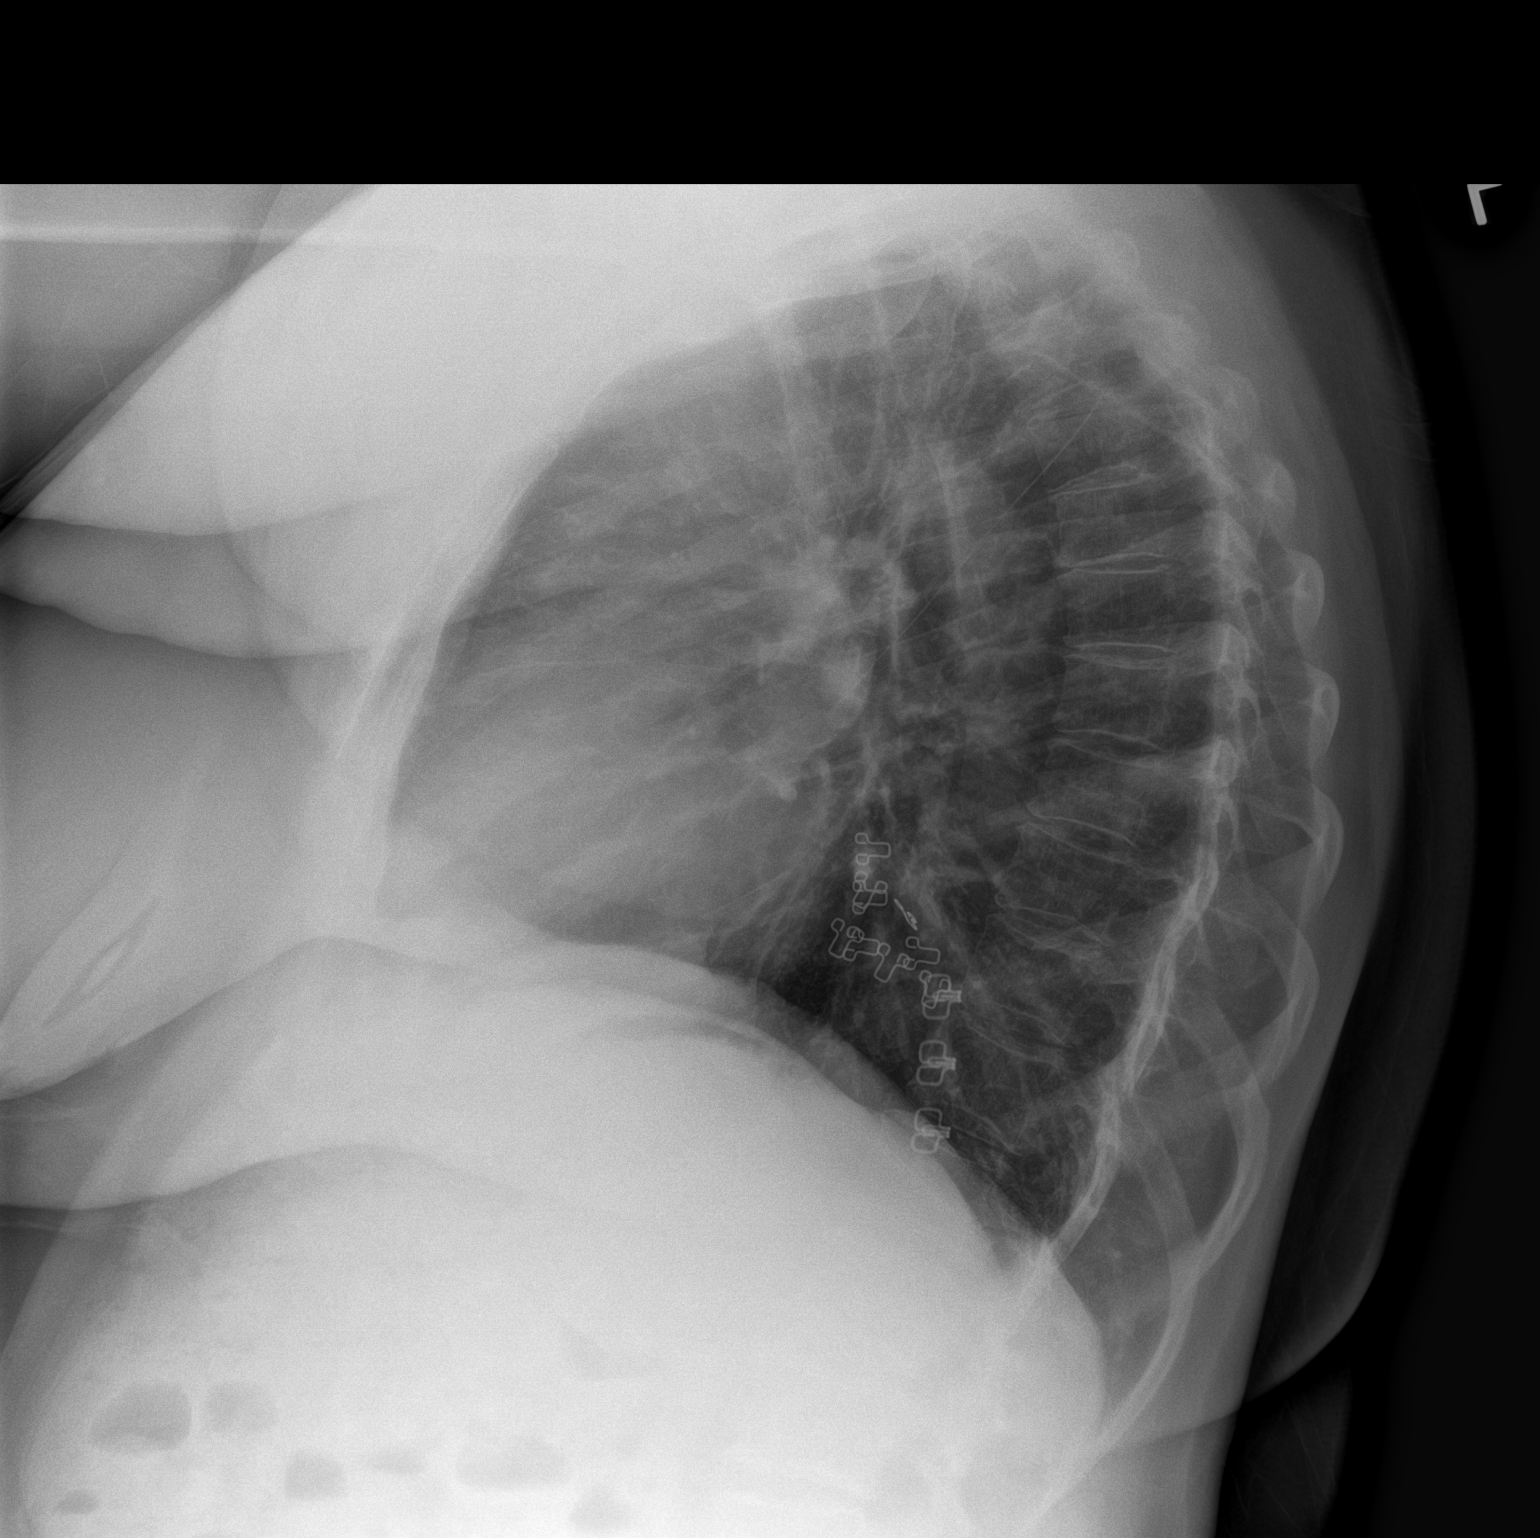

[2 of 2 positions shown; findings below may reference images not displayed]

FINDINGS: Benign calcified granuloma within the left lung base. Lungs are
otherwise clear. No pneumothorax or pleural effusion. Cardiac size
within normal limits. Pulmonary vascularity is normal. No acute bone
abnormality.
IMPRESSION: No active cardiopulmonary disease.

## 2023-10-03 ENCOUNTER — Other Ambulatory Visit: Payer: Self-pay | Admitting: Cardiology

## 2023-10-03 DIAGNOSIS — I255 Ischemic cardiomyopathy: Secondary | ICD-10-CM

## 2023-10-13 ENCOUNTER — Telehealth: Payer: Self-pay | Admitting: *Deleted

## 2023-10-13 NOTE — Telephone Encounter (Signed)
   Name: Mary Glass  DOB: 04-13-1956  MRN: [id number]  Primary Cardiologist: None   Preoperative team, please contact this patient and set up a phone call appointment for further preoperative risk assessment. Please obtain consent and complete medication review. Thank you for your help.  I confirm that guidance regarding antiplatelet and oral anticoagulation therapy has been completed and, if necessary, noted below.  Per protocol patient can hold Plavix 5 days prior to scheduled procedure.  Patient is also on ASA 81 mg and if needed can hold 7 days prior to procedure with plan to resume both medications when hemostasis is achieved and guided by surgical team.  I also confirmed the patient resides in the state of [place]. As per [facility] [employer] telemedicine laws, the patient must reside in the state in which the provider is licensed.   Napoleon Form, Leodis Rains, NP 10/13/2023, 9:32 AM [facility]

## 2023-10-13 NOTE — Telephone Encounter (Signed)
   Pre-operative Risk Assessment    Patient Name: Mary Glass  DOB: 04-12-1956 MRN: [id number]  Last OV: Dr. Odis Hollingshead 05/05/2023 Upcoming OV: None      Request for Surgical Clearance    Procedure:   Right Shoulder Scope, Rotator Cuff Repair  Date of Surgery:  Clearance TBD                                 Surgeon:  Dr. Ramond Marrow Surgeon's Group or Practice Name:  [facility] Phone number:  [phone] Fax number:  [phone]   Type of Clearance Requested:   - Medical  - Pharmacy:  Hold Aspirin and Clopidogrel (Plavix) Not Indicated   Type of Anesthesia:   Interscalene Block   Additional requests/questions:    Signed, Emmit Pomfret   10/13/2023, 9:26 AM

## 2023-10-13 NOTE — Telephone Encounter (Signed)
Left message for the pt to call back to schedule a tele pre op appt 

## 2023-10-15 NOTE — Telephone Encounter (Addendum)
Primary card is Dr. Odis Hollingshead. 2nd attempt to reach pt to set up tele pre op appt.

## 2023-10-16 ENCOUNTER — Telehealth: Payer: Self-pay

## 2023-10-16 NOTE — Telephone Encounter (Signed)
Patient states that they are trying to do surgery before christmas. Pt scheduled for tele visit on 10/28/23. Med rec and consent done

## 2023-10-16 NOTE — Telephone Encounter (Signed)
  Patient Consent for Virtual Visit         Mary Glass has provided verbal consent on 10/16/2023 for a virtual visit (video or telephone).   CONSENT FOR VIRTUAL VISIT FOR:  Mary Glass  By participating in this virtual visit I agree to the following:  I hereby voluntarily request, consent and authorize [facility] and its employed or contracted physicians, physician assistants, nurse practitioners or other licensed health care professionals (the Practitioner), to provide me with telemedicine health care services (the "Services") as deemed necessary by the treating Practitioner. I acknowledge and consent to receive the Services by the Practitioner via telemedicine. I understand that the telemedicine visit will involve communicating with the Practitioner through live audiovisual communication technology and the disclosure of certain medical information by electronic transmission. I acknowledge that I have been given the opportunity to request an in-person assessment or other available alternative prior to the telemedicine visit and am voluntarily participating in the telemedicine visit.  I understand that I have the right to withhold or withdraw my consent to the use of telemedicine in the course of my care at any time, without affecting my right to future care or treatment, and that the Practitioner or I may terminate the telemedicine visit at any time. I understand that I have the right to inspect all information obtained and/or recorded in the course of the telemedicine visit and may receive copies of available information for a reasonable fee.  I understand that some of the potential risks of receiving the Services via telemedicine include:  Delay or interruption in medical evaluation due to technological equipment failure or disruption; Information transmitted may not be sufficient (e.g. poor resolution of images) to allow for appropriate medical decision making by the  Practitioner; and/or  In rare instances, security protocols could fail, causing a breach of personal health information.  Furthermore, I acknowledge that it is my responsibility to provide information about my medical history, conditions and care that is complete and accurate to the best of my ability. I acknowledge that Practitioner's advice, recommendations, and/or decision may be based on factors not within their control, such as incomplete or inaccurate data provided by me or distortions of diagnostic images or specimens that may result from electronic transmissions. I understand that the practice of medicine is not an exact science and that Practitioner makes no warranties or guarantees regarding treatment outcomes. I acknowledge that a copy of this consent can be made available to me via my patient portal [facility] MyChart), or I can request a printed copy by calling the office of [facility].    I understand that my insurance will be billed for this visit.   I have read or had this consent read to me. I understand the contents of this consent, which adequately explains the benefits and risks of the Services being provided via telemedicine.  I have been provided ample opportunity to ask questions regarding this consent and the Services and have had my questions answered to my satisfaction. I give my informed consent for the services to be provided through the use of telemedicine in my medical care

## 2023-10-27 ENCOUNTER — Other Ambulatory Visit: Payer: Self-pay | Admitting: Cardiology

## 2023-10-27 DIAGNOSIS — E781 Pure hyperglyceridemia: Secondary | ICD-10-CM

## 2023-10-27 DIAGNOSIS — E782 Mixed hyperlipidemia: Secondary | ICD-10-CM

## 2023-10-28 ENCOUNTER — Ambulatory Visit: Payer: Medicare HMO | Attending: Cardiovascular Disease

## 2023-10-28 DIAGNOSIS — Z01810 Encounter for preprocedural cardiovascular examination: Secondary | ICD-10-CM | POA: Diagnosis not present

## 2023-10-28 NOTE — Progress Notes (Signed)
Virtual Visit via Telephone Note   Because of Mary Glass co-morbid illnesses, she is at least at moderate risk for complications without adequate follow up.  This format is felt to be most appropriate for this patient at this time.  The patient did not have access to video technology/had technical difficulties with video requiring transitioning to audio format only (telephone).  All issues noted in this document were discussed and addressed.  No physical exam could be performed with this format.  Please refer to the patient's chart for her consent to telehealth for [facility].  Evaluation Performed:  Preoperative cardiovascular risk assessment _____________   Date:  10/28/2023   Patient ID:  Mary Glass, DOB 03-28-1956, MRN [id number] Patient Location:  Home Provider location:   Office  Primary Care Provider:  Barbarann Ehlers Primary Cardiologist:  None  Chief Complaint / Patient Profile   67 y.o. y/o female with a h/o coronary artery disease, hypertension, HFpEF who is pending Right shoulder arthroscopy, rotator cuff repair and presents today for telephonic preoperative cardiovascular risk assessment.  History of Present Illness    Mary Glass is a 67 y.o. female who presents via audio/video conferencing for a telehealth visit today.  Pt was last seen in cardiology clinic on 05/05/2023 by Dr. Odis Hollingshead.  At that time Mary Glass was doing well .  The patient is now pending procedure as outlined above. Since her last visit, she continues to be stable from a cardiac standpoint.  Today she denies chest pain, shortness of breath, lower extremity edema, fatigue, palpitations, melena, hematuria, hemoptysis, diaphoresis, weakness, presyncope, syncope, orthopnea, and PND.   Past Medical History    Past Medical History:  Diagnosis Date   Allergy    Anxiety    Arthritis    Coronary atherosclerosis of native coronary artery    Depression    GERD  (gastroesophageal reflux disease)    Hyperlipidemia    LBBB (left bundle branch block)    Migraine    Past Surgical History:  Procedure Laterality Date   ABDOMINAL HYSTERECTOMY  1995   CORONARY PRESSURE/FFR STUDY N/A 10/09/2020   Procedure: INTRAVASCULAR PRESSURE WIRE/FFR STUDY;  Surgeon: Elder Negus, MD;  Location: [facility] INVASIVE CV LAB;  Service: Cardiovascular;  Laterality: N/A;   FOOT SURGERY     LEFT HEART CATH AND CORONARY ANGIOGRAPHY N/A 10/09/2020   Procedure: LEFT HEART CATH AND CORONARY ANGIOGRAPHY;  Surgeon: Elder Negus, MD;  Location: [facility] INVASIVE CV LAB;  Service: Cardiovascular;  Laterality: N/A;    Allergies  Allergies  Allergen Reactions   Hydrocodone-Acetaminophen     Other reaction(s): Other hyperactivity   Sumatriptan     Other reaction(s): Other Head pain    Ambien [Zolpidem Tartrate] Other (See Comments)    headaches   Cheese Other (See Comments)    Causes migraine   Codeine Other (See Comments)    hyperactivity   Benadryl [Diphenhydramine] Rash    Topical only, makes rash worse   Penicillins Nausea And Vomiting and Rash    Home Medications    Prior to Admission medications   Medication Sig Start Date End Date Taking? Authorizing Provider  amitriptyline (ELAVIL) 100 MG tablet Take 100 mg by mouth at bedtime. 03/18/21   [provider]  amoxicillin-clavulanate (AUGMENTIN) 875-125 MG tablet Take 1 tablet by mouth every 12 (twelve) hours. 09/09/23   Mardella Layman, MD  aspirin EC (ASPIRIN LOW DOSE) 81 MG tablet TAKE ONE TABLET BY MOUTH DAILY; SWALLOW WHOLE  AS DIRECTED BY YOUR DOCTOR 12/08/22   Tolia, Sunit, DO  buPROPion (WELLBUTRIN XL) 150 MG 24 hr tablet Take 1 tablet (150 mg total) by mouth daily. PATIENT NEEDS OFFICE VISIT FOR ADDITIONAL REFILLS 12/04/14   Lorre Munroe, NP  Calcium Carb-Cholecalciferol 500-125 MG-UNIT TABS Take 1 tablet by mouth daily.    [provider]  clopidogrel (PLAVIX) 75 MG tablet TAKE 1 TABLET  BY MOUTH DAILY 07/31/23   Tolia, Sunit, DO  fenofibrate (TRICOR) 145 MG tablet TAKE 1 TABLET BY MOUTH DAILY 07/31/23   Tolia, Sunit, DO  fluticasone (FLONASE) 50 MCG/ACT nasal spray Place 2 sprays into both nostrils daily as needed for allergies or rhinitis.    [provider]  Glucosamine-Chondroitin (MOVE FREE PO) Take 1 tablet by mouth daily.    [provider]  hydrOXYzine (ATARAX/VISTARIL) 50 MG tablet Take 50 mg by mouth at bedtime. 06/14/21   [provider]  isosorbide mononitrate (IMDUR) 30 MG 24 hr tablet Take 1 tablet (30 mg total) by mouth daily. 09/09/23   Tolia, Sunit, DO  KRILL OIL PO Take 1 capsule by mouth daily.     [provider]  MAGNESIUM PO Take 400 mg by mouth daily.    [provider]  metoprolol succinate (TOPROL-XL) 25 MG 24 hr tablet Take 1 tablet (25 mg total) by mouth daily. 05/09/22   Cantwell, Celeste C, PA-C  Multiple Vitamins-Minerals (OCUVITE ADULT 50+ PO) Take 1 tablet by mouth daily.    [provider]  nitroGLYCERIN (NITROSTAT) 0.4 MG SL tablet Place 1 tablet (0.4 mg total) under the tongue every 5 (five) minutes as needed for chest pain (Up to 3 doses in 15 minutes.). 09/22/22   Tolia, Sunit, DO  NON FORMULARY Take by mouth daily. Morning Complete powder- Fenton Malling    [provider]  NON FORMULARY cymbotica    [provider]  Nutritional Supplements (KETO PO) Take 2 tablets by mouth daily. EXIPURE    [provider]  ondansetron (ZOFRAN-ODT) 4 MG disintegrating tablet Take 1 tablet (4 mg total) by mouth every 8 (eight) hours as needed for nausea or vomiting. 09/09/23   Mardella Layman, MD  OVER THE COUNTER MEDICATION Take 1 tablet by mouth daily. Garden of Life Probiotic for Women    [provider]  PARoxetine (PAXIL) 30 MG tablet TAKE 1 TABLET (30 MG TOTAL) BY MOUTH DAILY. MUST SCHEDULE ANNUAL PHYSICAL FOR FURTHER REFILLS Patient taking differently: Take 30 mg by mouth daily.  06/03/16   Lorre Munroe, NP  RABEprazole (ACIPHEX) 20 MG tablet Take 20 mg by mouth 2 (two) times daily. 07/24/20   [provider]  ranolazine (RANEXA) 500 MG 12 hr tablet Take 1 tablet (500 mg total) by mouth 2 (two) times daily. 05/09/22 05/05/23  Cantwell, Celeste C, PA-C  rizatriptan (MAXALT) 10 MG tablet Take 10 mg by mouth as needed for migraine.  09/25/20   [provider]  rosuvastatin (CRESTOR) 20 MG tablet TAKE 1 TABLET BY MOUTH AT BEDTIME 08/07/23   Tolia, Sunit, DO  sacubitril-valsartan (ENTRESTO) 24-26 MG TAKE 1 TABLET BY MOUTH 2 TIMES A DAY 10/05/23   Tolia, Sunit, DO  TURMERIC PO Take 2,250 mg by mouth in the morning and at bedtime.    [provider]    Physical Exam    Vital Signs:  Mary Glass does not have vital signs available for review today.  Given telephonic nature of communication, physical exam is limited. AAOx3. NAD. Normal  affect.  Speech and respirations are unlabored.  Accessory Clinical Findings    None  Assessment & Plan    1.  Preoperative Cardiovascular Risk Assessment:Right Shoulder Scope, Rotator Cuff Repair                  Surgeon:  Dr. Ramond Marrow Surgeon's Group or Practice Name:  [facility] Fax number:  [phone]      Primary Cardiologist: Dr. Odis Hollingshead  Chart reviewed as part of pre-operative protocol coverage. Given past medical history and time since last visit, based on ACC/AHA guidelines, Mary Glass would be at acceptable risk for the planned procedure without further cardiovascular testing.   Her RCRI is moderate risk, 6.6% risk of major cardiac event.  She is able to complete greater than 4 METS of physical activity.  Patient was advised that if she develops new symptoms prior to surgery to contact our office to arrange a follow-up appointment.  He verbalized understanding.  She may hold her Plavix for 5 days prior to her procedure.  If needed her aspirin may be held for 7 days prior to her  procedure.  Please resume dual antiplatelet therapy as soon as hemostasis is achieved.  I will route this recommendation to the requesting party via Epic fax function and remove from pre-op pool.  Please call with questions.       Time:   Today, I have spent 5 minutes with the patient with telehealth technology discussing medical history, symptoms, and management plan.     Ronney Asters, NP  10/28/2023, 8:15 AM    Prior to patient's phone evaluation I spent greater than 10 minutes reviewing their past medical history and cardiac medications.

## 2023-11-05 ENCOUNTER — Ambulatory Visit: Payer: Self-pay | Admitting: Cardiology

## 2023-11-12 ENCOUNTER — Telehealth: Payer: Self-pay | Admitting: Cardiology

## 2023-11-12 NOTE — Telephone Encounter (Signed)
Per after hours patient called yesterday, 12/18 at 2:37 pm. Documentation states "To discuss clearance for an upcoming surgery. Fax info to [employer]."   Please advise.

## 2023-11-12 NOTE — Telephone Encounter (Signed)
I have sent faxed to [facility] of the cardiac clearance updates.

## 2023-12-01 ENCOUNTER — Other Ambulatory Visit: Payer: Self-pay | Admitting: Cardiology

## 2023-12-02 ENCOUNTER — Telehealth: Payer: Self-pay | Admitting: Cardiology

## 2023-12-02 NOTE — Telephone Encounter (Signed)
 New Message:    The nurse is on the phone. She has some concern about this patient. She needs to talk to someone in the Pre-Op pool please.

## 2023-12-02 NOTE — Telephone Encounter (Signed)
 Agree. LBBB is chronic. Mary Glass performed preop VV 12/4 and otherwise routed clearance at that time. Appreciate their team checking. No further action needed on chart, will remove from preop box.

## 2023-12-02 NOTE — Telephone Encounter (Signed)
 Took call from [facility], pt's PCP.  Just wanted to confirm that LBBB wasn't a new dx on pt's EKG.  She was advised that last EKG on file here, 04/2023, showed LBBB as well.  She was appreciative of me taking her call.

## 2023-12-16 ENCOUNTER — Other Ambulatory Visit: Payer: Self-pay | Admitting: Cardiology

## 2023-12-16 DIAGNOSIS — E781 Pure hyperglyceridemia: Secondary | ICD-10-CM

## 2023-12-16 DIAGNOSIS — E782 Mixed hyperlipidemia: Secondary | ICD-10-CM

## 2024-01-25 ENCOUNTER — Other Ambulatory Visit: Payer: Self-pay | Admitting: Cardiology

## 2024-02-18 ENCOUNTER — Ambulatory Visit: Admitting: Cardiology

## 2024-03-10 ENCOUNTER — Ambulatory Visit: Admitting: Cardiology

## 2024-03-28 ENCOUNTER — Ambulatory Visit: Admitting: Cardiology

## 2024-04-22 ENCOUNTER — Other Ambulatory Visit: Payer: Self-pay | Admitting: Cardiology

## 2024-04-22 DIAGNOSIS — E782 Mixed hyperlipidemia: Secondary | ICD-10-CM

## 2024-04-22 DIAGNOSIS — E781 Pure hyperglyceridemia: Secondary | ICD-10-CM

## 2024-05-12 ENCOUNTER — Telehealth: Payer: Self-pay | Admitting: Cardiology

## 2024-05-12 NOTE — Telephone Encounter (Signed)
*  STAT* If patient is at the pharmacy, call can be transferred to refill team.   1. Which medications need to be refilled? (please list name of each medication and dose if known)   ranolazine  (RANEXA ) 500 MG 12 hr tablet (Expired)   2. Would you like to learn more about the convenience, safety, & potential cost savings by using the [facility]?   3. Are you open to using the [facility] (Type [facility]. ).  4. Which pharmacy/location (including street and city if local pharmacy) is medication to be sent to?  [facility] [id number] - [place], [place] - [address]   5. Do they need a 30 day or 90 day supply?   90 day  Patient stated she is completely out of this medication.

## 2024-05-13 MED ORDER — RANOLAZINE ER 500 MG PO TB12: 500.0000 mg | ORAL_TABLET | Freq: Two times a day (BID) | ORAL | 0 refills | Status: DC

## 2024-05-13 NOTE — Telephone Encounter (Signed)
 Pt's medication was sent to pt's pharmacy as requested. Confirmation received.

## 2024-05-20 ENCOUNTER — Other Ambulatory Visit: Payer: Self-pay | Admitting: Cardiology

## 2024-05-20 DIAGNOSIS — E781 Pure hyperglyceridemia: Secondary | ICD-10-CM

## 2024-05-20 DIAGNOSIS — E782 Mixed hyperlipidemia: Secondary | ICD-10-CM

## 2024-05-23 ENCOUNTER — Other Ambulatory Visit: Payer: Self-pay

## 2024-05-23 DIAGNOSIS — E782 Mixed hyperlipidemia: Secondary | ICD-10-CM

## 2024-05-23 DIAGNOSIS — E781 Pure hyperglyceridemia: Secondary | ICD-10-CM

## 2024-05-23 MED ORDER — FENOFIBRATE 145 MG PO TABS: 145.0000 mg | ORAL_TABLET | Freq: Every day | ORAL | 0 refills | Status: DC

## 2024-06-01 ENCOUNTER — Other Ambulatory Visit: Payer: Self-pay | Admitting: Cardiology

## 2024-06-01 DIAGNOSIS — I255 Ischemic cardiomyopathy: Secondary | ICD-10-CM

## 2024-06-01 DIAGNOSIS — I2510 Atherosclerotic heart disease of native coronary artery without angina pectoris: Secondary | ICD-10-CM

## 2024-06-13 ENCOUNTER — Ambulatory Visit: Attending: Cardiology | Admitting: Cardiology

## 2024-06-13 ENCOUNTER — Encounter: Payer: Self-pay | Admitting: Cardiology

## 2024-06-13 VITALS — BP 120/74 | HR 98 | Resp 16 | Ht 65.0 in | Wt 216.2 lb

## 2024-06-13 DIAGNOSIS — I10 Essential (primary) hypertension: Secondary | ICD-10-CM

## 2024-06-13 DIAGNOSIS — I255 Ischemic cardiomyopathy: Secondary | ICD-10-CM

## 2024-06-13 DIAGNOSIS — E782 Mixed hyperlipidemia: Secondary | ICD-10-CM

## 2024-06-13 DIAGNOSIS — E781 Pure hyperglyceridemia: Secondary | ICD-10-CM

## 2024-06-13 DIAGNOSIS — I2510 Atherosclerotic heart disease of native coronary artery without angina pectoris: Secondary | ICD-10-CM | POA: Diagnosis not present

## 2024-06-13 DIAGNOSIS — I5032 Chronic diastolic (congestive) heart failure: Secondary | ICD-10-CM | POA: Diagnosis not present

## 2024-06-13 MED ORDER — NITROGLYCERIN 0.4 MG SL SUBL
0.4000 mg | SUBLINGUAL_TABLET | SUBLINGUAL | 2 refills | Status: AC | PRN
Start: 2024-06-13 — End: ?

## 2024-06-13 MED ORDER — ICOSAPENT ETHYL 1 G PO CAPS: 2.0000 g | ORAL_CAPSULE | Freq: Two times a day (BID) | ORAL | 3 refills | Status: DC

## 2024-06-13 MED ORDER — ISOSORBIDE MONONITRATE ER 30 MG PO TB24: 30.0000 mg | ORAL_TABLET | Freq: Every evening | ORAL | Status: DC

## 2024-06-13 MED ORDER — METOPROLOL SUCCINATE ER 25 MG PO TB24: 25.0000 mg | ORAL_TABLET | Freq: Every day | ORAL | 3 refills | Status: AC

## 2024-06-13 NOTE — Patient Instructions (Signed)
 Medication Instructions:   START Vascepa  1 g capsule, once a day  START metoprolol  succinate (toprol - XL) 25 mg once a day (HOLD if systolic blood pressure (top number) is less than 100 and Heart rate is less than 55)    REFILL on nitroglycerin   STOP Fenofibrate   CHANGE isosorbide  mononitrate (IMDUR ) to nightly    *If you need a refill on your cardiac medications before your next appointment, please call your pharmacy*  Lab Work: Lipid panel and CMP in 6 weeks. First week of September  If you have labs (blood work) drawn today and your tests are completely normal, you will receive your results only by: MyChart Message (if you have MyChart) OR A paper copy in the mail If you have any lab test that is abnormal or we need to change your treatment, we will call you to review the results.  Follow-Up: At [facility], you and your health needs are our priority.  As part of our continuing mission to provide you with exceptional heart care, our providers are all part of one team.  This team includes your primary Cardiologist (physician) and Advanced Practice Providers or APPs (Physician Assistants and Nurse Practitioners) who all work together to provide you with the care you need, when you need it.  Your next appointment:   1 year(s)  Provider:   Madonna Large, DO    We recommend signing up for the patient portal called MyChart.  Sign up information is provided on this After Visit Summary.  MyChart is used to connect with patients for Virtual Visits (Telemedicine).  Patients are able to view lab/test results, encounter notes, upcoming appointments, etc.  Non-urgent messages can be sent to your provider as well.   To learn more about what you can do with MyChart, go to [email or web address].

## 2024-06-13 NOTE — Progress Notes (Unsigned)
 Cardiology Office Note:  .   Date:  06/13/2024  ID:  Rollene Maiden, DOB 03-Jan-1956, MRN [id number] PCP:  Vaughn Lauraine KATHEE DEVONNA  Former Cardiology Providers: [facility] Providers Cardiologist:  None , [facility] (established care 10/08/2020) Electrophysiologist:  None  Click to update primary MD,subspecialty MD or APP then REFRESH:1}    Chief Complaint  Patient presents with   Nonobstructive atherosclerosis of coronary artery   Follow-up    History of Present Illness: Mary   Annis Glass is a 68 y.o. *** female whose past medical history and cardiovascular risk factors includes: Hypertension, coronary artery disease, LBBB, heart failure with improved EF, chronic HFpEF, hyperlipidemia, obesity.   In November 2021 patient presented with unstable angina underwent angiography was noted to have 50% LAD disease and echocardiogram noted moderately reduced LVEF at 35-40%.  Since then we uptitrate GDMT to maximally tolerated doses.  Most recent echocardiogram from May 2024 notes an improvement in LVEF at 50-55%.  She presents today for 1 year follow-up visit.  Since last office visit patient denies any anginal chest pain or heart failure symptoms.  No hospitalizations or urgent care visits for cardiovascular reasons.  She has been compliant with her medical therapy.  No significant weight gain.  Physical endurance remains stable, *** . Home SBP ranges between ***.   Review of Systems: .   ROS  Studies Reviewed:   EKG: EKG Interpretation Date/Time:  Monday June 13 2024 15:48:27 EDT Ventricular Rate:  99 PR Interval:  180 QRS Duration:  166 QT Interval:  420 QTC Calculation: 539 R Axis:   98  Text Interpretation: Normal sinus rhythm Rightward axis Non-specific intra-ventricular conduction block Inferior infarct (cited on or before 05-May-2022) Cannot rule out Anterior infarct , age undetermined When compared with ECG of 05-May-2022 21:22, No significant change was found Confirmed  by Michele Richardson [phone]) on 06/13/2024 3:57:31 PM  Echocardiogram: 10/08/2020 LVEF improved from 35-40% to 40-45% otherwise no significant change.   12/18/2020: Mildly depressed LV systolic function with visual EF 40-45%. Left ventricle cavity is normal in size. Abnormal septal wall motion due to left bundle branch block. Doppler evidence of grade I (impaired) diastolic dysfunction, normal LAP. Left ventricle regional wall motion findings: Mid anteroseptal, Mid inferoseptal and Apical septal hypokinesis. Trace aortic regurgitation. Mild (Grade I) mitral regurgitation.    04/06/2023: Low-normal LV systolic function with visual EF 50-55%. Left ventricle cavity is normal in size. Mid anteroseptal, Mid inferoseptal and Apical septal hypokinesis. Doppler evidence of grade I (impaired) diastolic dysfunction, normal LAP. Calculated EF 51%. Structurally normal tricuspid valve with trace regurgitation. No evidence of pulmonary hypertension. Compared to 11/2020, LVEF has slightly improved from 45% to know 51%.   Heart Catheterization: 10/09/2020  LM: Normal  LAD: Type 1 vessel that does not reach apex          Focal 50% LAD stenosis without involvement of adjacent diagonal branch  Ramus: Normal  LCx: Normal  RCA: Large, super-dominant, supplies apex. Normal vessel   RADIOLOGY: ***  Risk Assessment/Calculations:   NA   Labs:    LABORATORY DATA: External Labs: Collected: 12/04/2021 TSH 2.1 Total cholesterol 177, triglycerides 395, HDL 38, LDL 76 Vitamin D 25.8 BUN 14, creatinine 0.84. Sodium 141, potassium 4.1, chloride 102, bicarb 23. AST 25, ALT 24, alkaline phosphatase 97   External Labs: Collected: November 05 2022 available in Care Everywhere. Total cholesterol 163, triglycerides 223, HDL 36, LDL 89. Hemoglobin 14.2, hematocrit 41%. TSH 1.37    Physical  Exam:    Today's Vitals   06/13/24 1535  BP: 120/74  Pulse: 98  Resp: 16  SpO2: 95%  Weight: 216 lb 3.2 oz (98.1 kg)   Height: 5' 5 (1.651 m)   Body mass index is 35.98 kg/m. Wt Readings from Last 3 Encounters:  06/13/24 216 lb 3.2 oz (98.1 kg)  05/05/23 217 lb 12.8 oz (98.8 kg)  09/22/22 220 lb 3.2 oz (99.9 kg)    Physical Exam   Impression & Recommendation(s):  Impression:   ICD-10-CM   1. Nonobstructive atherosclerosis of coronary artery  I25.10 EKG 12-Lead    2. Heart failure with improved ejection fraction (HFimpEF) (HCC)  I50.32     3. Chronic heart failure with preserved ejection fraction (HFpEF) (HCC)  I50.32     4. Benign hypertension  I10     5. Mixed hyperlipidemia  E78.2     6. Hypertriglyceridemia  E78.1        Recommendation(s):  Nonobstructive atherosclerosis of coronary artery ***  Heart failure with improved ejection fraction (HFimpEF) (HCC) ***  Chronic heart failure with preserved ejection fraction (HFpEF) (HCC) ***  Benign hypertension ***  Mixed hyperlipidemia ***  Hypertriglyceridemia ***   Orders Placed:  Orders Placed This Encounter  Procedures   EKG 12-Lead     Final Medication List:   No orders of the defined types were placed in this encounter.   Medications Discontinued During This Encounter  Medication Reason   amoxicillin -clavulanate (AUGMENTIN ) 875-125 MG tablet No longer needed (for PRN medications)   ondansetron  (ZOFRAN -ODT) 4 MG disintegrating tablet Patient Preference   OVER THE COUNTER MEDICATION Patient Preference     Current Outpatient Medications:    amitriptyline  (ELAVIL ) 100 MG tablet, Take 100 mg by mouth at bedtime., Disp: , Rfl:    aspirin  EC (ASPIRIN  LOW DOSE) 81 MG tablet, TAKE 1 TABLET BY MOUTH DAILY; SWALLOW WHOLE AS DIRECTED BY DOCTOR, Disp: 90 tablet, Rfl: 0   buPROPion  (WELLBUTRIN  XL) 150 MG 24 hr tablet, Take 1 tablet (150 mg total) by mouth daily. PATIENT NEEDS OFFICE VISIT FOR ADDITIONAL REFILLS, Disp: 90 tablet, Rfl: 3   Calcium  Carb-Cholecalciferol 500-125 MG-UNIT TABS, Take 1 tablet by mouth daily., Disp:  , Rfl:    clopidogrel  (PLAVIX ) 75 MG tablet, TAKE 1 TABLET BY MOUTH DAILY, Disp: 90 tablet, Rfl: 1   fenofibrate  (TRICOR ) 145 MG tablet, Take 1 tablet (145 mg total) by mouth daily., Disp: 30 tablet, Rfl: 0   fluticasone (FLONASE) 50 MCG/ACT nasal spray, Place 2 sprays into both nostrils daily as needed for allergies or rhinitis., Disp: , Rfl:    Glucosamine-Chondroitin (MOVE FREE PO), Take 1 tablet by mouth daily., Disp: , Rfl:    hydrOXYzine  (ATARAX /VISTARIL ) 50 MG tablet, Take 50 mg by mouth at bedtime., Disp: , Rfl:    isosorbide  mononitrate (IMDUR ) 30 MG 24 hr tablet, TAKE 1 TABLET BY MOUTH DAILY, Disp: 90 tablet, Rfl: 0   KRILL OIL PO, Take 1 capsule by mouth daily. , Disp: , Rfl:    MAGNESIUM PO, Take 400 mg by mouth daily., Disp: , Rfl:    Multiple Vitamins-Minerals (OCUVITE ADULT 50+ PO), Take 1 tablet by mouth daily., Disp: , Rfl:    nitroGLYCERIN  (NITROSTAT ) 0.4 MG SL tablet, Place 1 tablet (0.4 mg total) under the tongue every 5 (five) minutes as needed for chest pain (Up to 3 doses in 15 minutes.)., Disp: 30 tablet, Rfl: 2   NON FORMULARY, Take by mouth daily. Morning Complete powderGLENWOOD cullen,  Disp: , Rfl:    NON FORMULARY, cymbotica, Disp: , Rfl:    Nutritional Supplements (KETO PO), Take 2 tablets by mouth daily. EXIPURE, Disp: , Rfl:    PARoxetine  (PAXIL ) 30 MG tablet, TAKE 1 TABLET (30 MG TOTAL) BY MOUTH DAILY. MUST SCHEDULE ANNUAL PHYSICAL FOR FURTHER REFILLS (Patient taking differently: Take 30 mg by mouth daily.), Disp: 30 tablet, Rfl: 0   RABEprazole  (ACIPHEX ) 20 MG tablet, Take 20 mg by mouth 2 (two) times daily., Disp: , Rfl:    ranolazine  (RANEXA ) 500 MG 12 hr tablet, Take 1 tablet (500 mg total) by mouth 2 (two) times daily., Disp: 180 tablet, Rfl: 0   rizatriptan  (MAXALT ) 10 MG tablet, Take 10 mg by mouth as needed for migraine. , Disp: , Rfl:    rosuvastatin  (CRESTOR ) 20 MG tablet, TAKE 1 TABLET BY MOUTH AT BEDTIME, Disp: 90 tablet, Rfl: 3   sacubitril-valsartan  (ENTRESTO ) 24-26 MG, TAKE 1 TABLET BY MOUTH 2 TIMES A DAY, Disp: 60 tablet, Rfl: 7   TURMERIC PO, Take 2,250 mg by mouth in the morning and at bedtime., Disp: , Rfl:    metoprolol  succinate (TOPROL -XL) 25 MG 24 hr tablet, Take 1 tablet (25 mg total) by mouth daily. (Patient not taking: Reported on 06/13/2024), Disp: 90 tablet, Rfl: 3  Consent:   NA  Disposition:   ***  Her questions and concerns were addressed to her satisfaction. She voices understanding of the recommendations provided during this encounter.    Signed, Madonna Michele HAS, [facility]  A Division of [facility] [address], [place], [place] [address]  [place], [place] [address] 06/13/2024 3:57 PM

## 2024-06-14 ENCOUNTER — Encounter: Payer: Self-pay | Admitting: Cardiology

## 2024-06-15 ENCOUNTER — Other Ambulatory Visit (HOSPITAL_COMMUNITY): Payer: Self-pay

## 2024-06-15 ENCOUNTER — Telehealth: Payer: Self-pay

## 2024-06-15 ENCOUNTER — Telehealth: Payer: Self-pay | Admitting: Cardiology

## 2024-06-15 ENCOUNTER — Telehealth: Payer: Self-pay | Admitting: Pharmacy Technician

## 2024-06-15 NOTE — Telephone Encounter (Signed)
  Pt c/o medication issue:  1. Name of Medication: icosapent  Ethyl (VASCEPA ) 1 g capsule   2. How are you currently taking this medication (dosage and times per day)?   Take 2 capsules (2 g total) by mouth 2 (two) times daily.    3. Are you having a reaction (difficulty breathing--STAT)? No   4. What is your medication issue? Pt would like to confirm on how she takes this medication. When she picked this prescription it sad to take two tablets twice a day. However, on her AVS it says START Vascepa  1 g capsule, once a day  she would like for clarification

## 2024-06-15 NOTE — Telephone Encounter (Signed)
 Patient enrolled in copay card. Info sent to patient via mychart.

## 2024-06-15 NOTE — Telephone Encounter (Addendum)
 Enrolled in copay card.   Info sent to pt via mychart.

## 2024-06-15 NOTE — Telephone Encounter (Signed)
 No prior authorization needed. Sent to med assist for cost   Copay 122.00 for 30 days for brand vascepa . Generic not covered

## 2024-06-15 NOTE — Telephone Encounter (Signed)
 Pt made aware that Rx sent in is correct and she should be taking 2g BID. States this medication was very expensive and she does not think she can afford this medicine.  Aware I will forward to prior auth team to see if any assistance is out there for this.  Pt agreeable to plan.

## 2024-06-20 ENCOUNTER — Other Ambulatory Visit: Payer: Self-pay | Admitting: Cardiology

## 2024-06-20 DIAGNOSIS — E781 Pure hyperglyceridemia: Secondary | ICD-10-CM

## 2024-06-20 DIAGNOSIS — E782 Mixed hyperlipidemia: Secondary | ICD-10-CM

## 2024-06-30 ENCOUNTER — Other Ambulatory Visit: Payer: Self-pay | Admitting: Cardiology

## 2024-06-30 DIAGNOSIS — E782 Mixed hyperlipidemia: Secondary | ICD-10-CM

## 2024-06-30 DIAGNOSIS — E781 Pure hyperglyceridemia: Secondary | ICD-10-CM

## 2024-07-19 ENCOUNTER — Other Ambulatory Visit: Payer: Self-pay | Admitting: Cardiology

## 2024-08-01 ENCOUNTER — Other Ambulatory Visit: Payer: Self-pay | Admitting: Cardiology

## 2024-08-01 DIAGNOSIS — I255 Ischemic cardiomyopathy: Secondary | ICD-10-CM

## 2024-08-08 ENCOUNTER — Other Ambulatory Visit: Payer: Self-pay | Admitting: Cardiology

## 2024-09-02 ENCOUNTER — Other Ambulatory Visit: Payer: Self-pay

## 2024-09-02 DIAGNOSIS — I2510 Atherosclerotic heart disease of native coronary artery without angina pectoris: Secondary | ICD-10-CM

## 2024-09-02 DIAGNOSIS — I255 Ischemic cardiomyopathy: Secondary | ICD-10-CM

## 2024-09-05 ENCOUNTER — Other Ambulatory Visit: Payer: Self-pay

## 2024-09-05 DIAGNOSIS — I255 Ischemic cardiomyopathy: Secondary | ICD-10-CM

## 2024-09-05 DIAGNOSIS — I2510 Atherosclerotic heart disease of native coronary artery without angina pectoris: Secondary | ICD-10-CM

## 2024-09-06 MED ORDER — ASPIRIN 81 MG PO TBEC: DELAYED_RELEASE_TABLET | ORAL | 2 refills | Status: AC

## 2024-09-06 MED ORDER — ISOSORBIDE MONONITRATE ER 30 MG PO TB24: 30.0000 mg | ORAL_TABLET | Freq: Every evening | ORAL | 2 refills | Status: AC

## 2024-09-08 ENCOUNTER — Other Ambulatory Visit (HOSPITAL_COMMUNITY): Payer: Self-pay

## 2024-09-08 MED ORDER — ISOSORBIDE MONONITRATE ER 30 MG PO TB24
30.0000 mg | ORAL_TABLET | Freq: Every evening | ORAL | 2 refills | Status: AC
Start: 2024-09-08 — End: ?
  Filled 2024-09-08: qty 90, 90d supply, fill #0

## 2024-11-14 ENCOUNTER — Other Ambulatory Visit: Payer: Self-pay | Admitting: Cardiology

## 2024-12-08 ENCOUNTER — Other Ambulatory Visit: Payer: Self-pay | Admitting: Cardiology

## 2024-12-08 DIAGNOSIS — E781 Pure hyperglyceridemia: Secondary | ICD-10-CM

## 2024-12-08 DIAGNOSIS — E782 Mixed hyperlipidemia: Secondary | ICD-10-CM

## 2024-12-09 NOTE — Telephone Encounter (Signed)
 In accordance with refill protocols, please review and address the following requirements before this medication refill can be authorized:  LABS
